# Patient Record
Sex: Female | Born: 1985
Health system: Southern US, Community
[De-identification: ages and names within clinical notes are randomized; demographics above are authoritative.]

## PROBLEM LIST (undated history)

## (undated) DIAGNOSIS — T7840XA Allergy, unspecified, initial encounter: Secondary | ICD-10-CM

## (undated) DIAGNOSIS — L309 Dermatitis, unspecified: Secondary | ICD-10-CM

## (undated) DIAGNOSIS — B019 Varicella without complication: Secondary | ICD-10-CM

## (undated) HISTORY — DX: Allergy, unspecified, initial encounter: T78.40XA

## (undated) HISTORY — PX: WISDOM TOOTH EXTRACTION: SHX21

## (undated) HISTORY — DX: Varicella without complication: B01.9

## (undated) HISTORY — DX: Dermatitis, unspecified: L30.9

---

## 2011-06-03 ENCOUNTER — Ambulatory Visit (INDEPENDENT_AMBULATORY_CARE_PROVIDER_SITE_OTHER): Payer: Managed Care, Other (non HMO) | Admitting: Family

## 2011-06-03 ENCOUNTER — Encounter: Payer: Self-pay | Admitting: Family

## 2011-06-03 VITALS — BP 100/80 | Ht 65.0 in | Wt 208.0 lb

## 2011-06-03 DIAGNOSIS — H9203 Otalgia, bilateral: Secondary | ICD-10-CM

## 2011-06-03 DIAGNOSIS — H6690 Otitis media, unspecified, unspecified ear: Secondary | ICD-10-CM

## 2011-06-03 DIAGNOSIS — H9209 Otalgia, unspecified ear: Secondary | ICD-10-CM

## 2011-06-03 MED ORDER — TRAMADOL HCL 50 MG PO TABS: 50.0000 mg | ORAL_TABLET | Freq: Three times a day (TID) | ORAL | Status: AC | PRN

## 2011-06-03 NOTE — Progress Notes (Signed)
  Subjective:    Patient ID: Patricia Chase, female    DOB: 1985-03-02, 26 y.o.   MRN: [id number]  HPI Comments: 26 yo black female presents with c/o bilateral ear discomfort and itching getting worse x several days. Pain described as pressure and throbbing 8/10. "Nothing makes the pain better or worse." Had same episode last year and was seen at [facility] and prescribed antibiotics. Denies childhood history of ear problems, fever/chills, sinus drainage, sorethroat, or cough.   Otalgia  Pertinent negatives include no ear discharge, hearing loss, neck pain or rhinorrhea.      Review of Systems  Constitutional: Negative.   HENT: Positive for ear pain. Negative for hearing loss, nosebleeds, congestion, facial swelling, rhinorrhea, sneezing, drooling, mouth sores, neck pain, neck stiffness, dental problem, postnasal drip, sinus pressure, tinnitus and ear discharge.   Eyes: Negative.   Respiratory: Negative.   Cardiovascular: Negative.    Past Medical History  Diagnosis Date  . Chicken pox   . Allergy     History   Social History  . Marital Status: Unknown    Spouse Name: N/A    Number of Children: N/A  . Years of Education: N/A   Occupational History  . Not on file.   Social History Main Topics  . Smoking status: Never Smoker   . Smokeless tobacco: Not on file  . Alcohol Use: Yes     occassionally  . Drug Use: No  . Sexually Active: Not on file   Other Topics Concern  . Not on file   Social History Narrative  . No narrative on file    No past surgical history on file.  Family History  Problem Relation Age of Onset  . Hypertension Mother   . Diabetes Mother   . Heart disease Father   . Hypertension Father   . Hypertension Maternal Grandmother   . Diabetes Maternal Grandmother   . Hypertension Paternal Grandmother   . Heart disease Paternal Grandfather   . Hypertension Paternal Grandfather     No Known Allergies  Current Outpatient Prescriptions on  File Prior to Visit  Medication Sig Dispense Refill  . loratadine (CLARITIN) 10 MG tablet Take 10 mg by mouth daily.        BP 100/80  Ht 5\' 5"  (1.651 m)  Wt 208 lb (94.348 kg)  BMI 34.61 kg/m2  LMP 04/02/2013chart     Objective:   Physical Exam  Constitutional: She is oriented to person, place, and time. She appears well-developed and well-nourished. No distress.  HENT:  Right Ear: External ear normal.  Left Ear: External ear normal.  Mouth/Throat: Oropharynx is clear and moist. No oropharyngeal exudate.  Eyes: Right eye exhibits no discharge. Left eye exhibits no discharge.  Cardiovascular: Normal rate, regular rhythm, normal heart sounds and intact distal pulses.  Exam reveals no gallop and no friction rub.   No murmur heard. Pulmonary/Chest: Effort normal and breath sounds normal. No respiratory distress. She has no wheezes. She has no rales. She exhibits no tenderness.  Neurological: She is alert and oriented to person, place, and time.  Skin: Skin is warm and dry. She is not diaphoretic.          Assessment & Plan:  Assessment: Otitis externa, Bilateral Ear Pain  Plan: Ofloxacin drops, Teaching handouts provided on diagnosis and treatments, opportunity for questions provided. Encouraged to RTC if s/s get worse. RTC for CPE

## 2011-06-03 NOTE — Patient Instructions (Signed)
Otitis Externa  Otitis externa ("swimmer's ear") is a germ (bacterial) or fungal infection of the outer ear canal (from the eardrum to the outside of the ear). Swimming in dirty water may cause swimmer's ear. It also may be caused by moisture in the ear from water remaining after swimming or bathing. Often the first signs of infection may be itching in the ear canal. This may progress to ear canal swelling, redness, and pus drainage, which may be signs of infection.  HOME CARE INSTRUCTIONS    Apply the antibiotic drops to the ear canal as prescribed by your doctor.   This can be a very painful medical condition. A strong pain reliever may be prescribed.   Only take over-the-counter or prescription medicines for pain, discomfort, or fever as directed by your caregiver.   If your caregiver has given you a follow-up appointment, it is very important to keep that appointment. Not keeping the appointment could result in a chronic or permanent injury, pain, hearing loss and disability. If there is any problem keeping the appointment, you must call back to this facility for assistance.  PREVENTION    It is important to keep your ear dry. Use the corner of a towel to wick water out of the ear canal after swimming or bathing.   Avoid scratching in your ear. This can damage the ear canal or remove the protective wax lining the canal and make it easier for germs (bacteria) or a fungus to grow.   You may use ear drops made of rubbing alcohol and vinegar after swimming to prevent future "swimmer's ear" infections. Make up a small bottle of equal parts white vinegar and alcohol. Put 3 or 4 drops into each ear after swimming.   Avoid swimming in lakes, polluted water, or poorly chlorinated pools.  SEEK MEDICAL CARE IF:    An oral temperature above 102 F (38.9 C) develops.   Your ear is still painful after 3 days and shows signs of getting worse (redness, swelling, pain, or pus).  MAKE SURE YOU:    Understand these  instructions.   Will watch your condition.   Will get help right away if you are not doing well or get worse.  Document Released: 01/27/2005 Document Revised: 01/16/2011 Document Reviewed: 09/03/2007  [employer] Patient Information 2012 [employer].

## 2012-06-16 ENCOUNTER — Other Ambulatory Visit (INDEPENDENT_AMBULATORY_CARE_PROVIDER_SITE_OTHER): Payer: BC Managed Care – PPO

## 2012-06-16 DIAGNOSIS — Z0000 Encounter for general adult medical examination without abnormal findings: Secondary | ICD-10-CM

## 2012-06-16 LAB — CBC WITH DIFFERENTIAL/PLATELET
Basophils Relative: 0.2 % (ref 0.0–3.0)
Eosinophils Relative: 0.5 % (ref 0.0–5.0)
HCT: 37.2 % (ref 36.0–46.0)
Hemoglobin: 12.7 g/dL (ref 12.0–15.0)
Lymphs Abs: 3 10*3/uL (ref 0.7–4.0)
MCHC: 34.1 g/dL (ref 30.0–36.0)
MCV: 83.8 fl (ref 78.0–100.0)
Monocytes Absolute: 0.6 10*3/uL (ref 0.1–1.0)
Neutro Abs: 7.1 10*3/uL (ref 1.4–7.7)
Neutrophils Relative %: 66 % (ref 43.0–77.0)
RBC: 4.44 Mil/uL (ref 3.87–5.11)
WBC: 10.8 10*3/uL — ABNORMAL HIGH (ref 4.5–10.5)

## 2012-06-16 LAB — POCT URINALYSIS DIPSTICK
Ketones, UA: NEGATIVE
Protein, UA: NEGATIVE
Urobilinogen, UA: 0.2
pH, UA: 6.5

## 2012-06-16 LAB — HEPATIC FUNCTION PANEL
ALT: 16 U/L (ref 0–35)
Albumin: 3.8 g/dL (ref 3.5–5.2)
Bilirubin, Direct: 0 mg/dL (ref 0.0–0.3)
Total Protein: 6.8 g/dL (ref 6.0–8.3)

## 2012-06-16 LAB — BASIC METABOLIC PANEL
CO2: 26 mEq/L (ref 19–32)
Chloride: 107 mEq/L (ref 96–112)
Potassium: 3.6 mEq/L (ref 3.5–5.1)
Sodium: 138 mEq/L (ref 135–145)

## 2012-06-16 LAB — LIPID PANEL
LDL Cholesterol: 66 mg/dL (ref 0–99)
VLDL: 11.2 mg/dL (ref 0.0–40.0)

## 2012-06-22 ENCOUNTER — Encounter: Payer: Managed Care, Other (non HMO) | Admitting: Family

## 2012-07-06 ENCOUNTER — Other Ambulatory Visit (HOSPITAL_COMMUNITY)
Admission: RE | Admit: 2012-07-06 | Discharge: 2012-07-06 | Disposition: A | Discharge status: Home / Self Care | Payer: BC Managed Care – PPO | Source: Ambulatory Visit | Attending: Family | Admitting: Family

## 2012-07-06 ENCOUNTER — Ambulatory Visit (INDEPENDENT_AMBULATORY_CARE_PROVIDER_SITE_OTHER): Payer: BC Managed Care – PPO | Admitting: Family

## 2012-07-06 ENCOUNTER — Encounter: Payer: Self-pay | Admitting: Family

## 2012-07-06 VITALS — BP 98/62 | HR 94 | Ht 65.0 in | Wt 208.0 lb

## 2012-07-06 DIAGNOSIS — Z01419 Encounter for gynecological examination (general) (routine) without abnormal findings: Secondary | ICD-10-CM | POA: Insufficient documentation

## 2012-07-06 DIAGNOSIS — Z23 Encounter for immunization: Secondary | ICD-10-CM

## 2012-07-06 DIAGNOSIS — E669 Obesity, unspecified: Secondary | ICD-10-CM

## 2012-07-06 DIAGNOSIS — Z0000 Encounter for general adult medical examination without abnormal findings: Secondary | ICD-10-CM

## 2012-07-06 LAB — POCT URINALYSIS DIPSTICK
Glucose, UA: NEGATIVE
Spec Grav, UA: 1.02
Urobilinogen, UA: 0.2

## 2012-07-06 NOTE — Progress Notes (Signed)
  Subjective:    Patient ID: Patricia Chase, female    DOB: 1985-10-20, 27 y.o.   MRN: [id number]  HPI 27 year old female presents to PCP for annual physical. Denies any issues at this time. Reports LMP 06/22/2012. States she is not sexually active; Reports G0P0. Confirms monthly self breast exam.    Review of Systems  Constitutional: Negative.   HENT: Negative.   Eyes: Negative.   Respiratory: Negative.   Cardiovascular: Negative.   Gastrointestinal: Negative.   Endocrine: Negative.   Genitourinary: Negative.   Musculoskeletal: Negative.   Skin: Negative.   Allergic/Immunologic: Negative.   Neurological: Negative.   Hematological: Negative.   Psychiatric/Behavioral: Negative.    Past Medical History  Diagnosis Date  . Chicken pox   . Allergy     History   Social History  . Marital Status: Single    Spouse Name: N/A    Number of Children: N/A  . Years of Education: N/A   Occupational History  . Not on file.   Social History Main Topics  . Smoking status: Never Smoker   . Smokeless tobacco: Not on file  . Alcohol Use: Yes     Comment: occassionally  . Drug Use: No  . Sexually Active: Not on file   Other Topics Concern  . Not on file   Social History Narrative  . No narrative on file    No past surgical history on file.  Family History  Problem Relation Age of Onset  . Hypertension Mother   . Diabetes Mother   . Heart disease Father   . Hypertension Father   . Hypertension Maternal Grandmother   . Diabetes Maternal Grandmother   . Hypertension Paternal Grandmother   . Heart disease Paternal Grandfather   . Hypertension Paternal Grandfather     No Known Allergies  Current Outpatient Prescriptions on File Prior to Visit  Medication Sig Dispense Refill  . loratadine (CLARITIN) 10 MG tablet Take 10 mg by mouth daily.       No current facility-administered medications on file prior to visit.    BP 98/62  Pulse 94  Ht 5\' 5"  (1.651 m)  Wt 208 lb  (94.348 kg)  BMI 34.61 kg/m2  LMP 05/13/2014chart               Objective:   Physical Exam  Constitutional: She is oriented to person, place, and time. She appears well-developed and well-nourished.  HENT:  Head: Normocephalic and atraumatic.  Mouth/Throat: Oropharynx is clear and moist.  Eyes: Pupils are equal, round, and reactive to light.  Neck: Normal range of motion.  Cardiovascular: Normal rate, regular rhythm, normal heart sounds and intact distal pulses.   Pulmonary/Chest: Effort normal and breath sounds normal.  Abdominal: Soft. Bowel sounds are normal. She exhibits no distension. There is no tenderness. There is no rebound.  Genitourinary: Vagina normal and uterus normal.  Musculoskeletal: Normal range of motion.  Neurological: She is alert and oriented to person, place, and time. She has normal reflexes.  Skin: Skin is warm and dry.  Psychiatric: She has a normal mood and affect.          Assessment & Plan:  1. Complete Physical Exam  2. Obesity  Plan: Encouraged to adopt a healthy diet and physical activity 30-60 minutes 5-7 days per week. Instructed to follow up with PCP for any acute issues and return for annual routine physical. Monthly self breast exams.

## 2012-12-16 ENCOUNTER — Other Ambulatory Visit: Payer: Self-pay

## 2013-09-02 ENCOUNTER — Encounter: Payer: Self-pay | Admitting: Family

## 2013-09-02 ENCOUNTER — Ambulatory Visit (INDEPENDENT_AMBULATORY_CARE_PROVIDER_SITE_OTHER): Payer: BC Managed Care – PPO | Admitting: Family

## 2013-09-02 VITALS — BP 102/64 | HR 93 | Ht 65.0 in | Wt 259.5 lb

## 2013-09-02 DIAGNOSIS — L219 Seborrheic dermatitis, unspecified: Secondary | ICD-10-CM | POA: Insufficient documentation

## 2013-09-02 DIAGNOSIS — L218 Other seborrheic dermatitis: Secondary | ICD-10-CM

## 2013-09-02 DIAGNOSIS — Z0000 Encounter for general adult medical examination without abnormal findings: Secondary | ICD-10-CM

## 2013-09-02 LAB — CBC WITH DIFFERENTIAL/PLATELET
Basophils Absolute: 0 10*3/uL (ref 0.0–0.1)
Basophils Relative: 0 % (ref 0–1)
EOS ABS: 0 10*3/uL (ref 0.0–0.7)
EOS PCT: 0 % (ref 0–5)
HCT: 37.7 % (ref 36.0–46.0)
HEMOGLOBIN: 12.5 g/dL (ref 12.0–15.0)
LYMPHS ABS: 3.5 10*3/uL (ref 0.7–4.0)
Lymphocytes Relative: 28 % (ref 12–46)
MCH: 27.1 pg (ref 26.0–34.0)
MCHC: 33.2 g/dL (ref 30.0–36.0)
MCV: 81.6 fL (ref 78.0–100.0)
MONO ABS: 0.6 10*3/uL (ref 0.1–1.0)
MONOS PCT: 5 % (ref 3–12)
Neutro Abs: 8.3 10*3/uL — ABNORMAL HIGH (ref 1.7–7.7)
Neutrophils Relative %: 67 % (ref 43–77)
Platelets: 361 10*3/uL (ref 150–400)
RBC: 4.62 MIL/uL (ref 3.87–5.11)
RDW: 14.6 % (ref 11.5–15.5)
WBC: 12.4 10*3/uL — ABNORMAL HIGH (ref 4.0–10.5)

## 2013-09-02 MED ORDER — DERMA-SMOOTHE/FS BODY 0.01 % EX OIL: 1.0000 "application " | TOPICAL_OIL | CUTANEOUS | Status: AC

## 2013-09-02 NOTE — Patient Instructions (Addendum)
Seborrheic Dermatitis Seborrheic dermatitis involves pink or red skin with greasy, flaky scales. This is often found on the scalp, eyebrows, nose, bearded area, and on or behind the ears. It can also occur on the central chest. It often occurs where there are more oil (sebaceous) glands. This condition is also known as dandruff. When this condition affects a baby's scalp, it is called cradle cap. It may come and go for no known reason. It can occur at any time of life from infancy to old age. CAUSES  The cause is unknown. It is not the result of too little moisture or too much oil. In some people, seborrheic dermatitis flare-ups seem to be triggered by stress. It also commonly occurs in people with certain diseases such as Parkinson's disease or HIV/AIDS. SYMPTOMS   Thick scales on the scalp.  Redness on the face or in the armpits.  The skin may seem oily or dry, but moisturizers do not help.  In infants, seborrheic dermatitis appears as scaly redness that does not seem to bother the baby. In some babies, it affects only the scalp. In others, it also affects the neck creases, armpits, groin, or behind the ears.  In adults and adolescents, seborrheic dermatitis may affect only the scalp. It may look patchy or spread out, with areas of redness and flaking. Other areas commonly affected include:  Eyebrows.  Eyelids.  Forehead.  Skin behind the ears.  Outer ears.  Chest.  Armpits.  Nose creases.  Skin creases under the breasts.  Skin between the buttocks.  Groin.  Some adults and adolescents feel itching or burning in the affected areas. DIAGNOSIS  Your caregiver can usually tell what the problem is by doing a physical exam. TREATMENT   Cortisone (steroid) ointments, creams, and lotions can help decrease inflammation.  Babies can be treated with baby oil to soften the scales, then they may be washed with baby shampoo. If this does not help, a prescription topical steroid  medicine may work.  Adults can use medicated shampoos.  Your caregiver may prescribe corticosteroid cream and shampoo containing an antifungal or yeast medicine (ketoconazole). Hydrocortisone or anti-yeast cream can be rubbed directly onto seborrheic dermatitis patches. Yeast does not cause seborrheic dermatitis, but it seems to add to the problem. In infants, seborrheic dermatitis is often worst during the first year of life. It tends to disappear on its own as the child grows. However, it may return during the teenage years. In adults and adolescents, seborrheic dermatitis tends to be a long-lasting condition that comes and goes over many years. HOME CARE INSTRUCTIONS   Use prescribed medicines as directed.  In infants, do not aggressively remove the scales or flakes on the scalp with a comb or by other means. This may lead to hair loss. SEEK MEDICAL CARE IF:   The problem does not improve from the medicated shampoos, lotions, or other medicines given by your caregiver.  You have any other questions or concerns. Document Released: 01/27/2005 Document Revised: 07/29/2011 Document Reviewed: 06/18/2009 [facility] Patient Information 2015 [place], [place]. This information is not intended to replace advice given to you by your health care provider. Make sure you discuss any questions you have with your health care provider.   Exercise to Lose Weight Exercise and a healthy diet may help you lose weight. Your doctor may suggest specific exercises. EXERCISE IDEAS AND TIPS  Choose low-cost things you enjoy doing, such as walking, bicycling, or exercising to workout videos.  Take stairs instead  of the elevator.  Walk during your lunch break.  Park your car further away from work or school.  Go to a gym or an exercise class.  Start with 5 to 10 minutes of exercise each day. Build up to 30 minutes of exercise 4 to 6 days a week.  Wear shoes with good support and comfortable  clothes.  Stretch before and after working out.  Work out until you breathe harder and your heart beats faster.  Drink extra water when you exercise.  Do not do so much that you hurt yourself, feel dizzy, or get very short of breath. Exercises that burn about 150 calories:  Running 1  miles in 15 minutes.  Playing volleyball for 45 to 60 minutes.  Washing and waxing a car for 45 to 60 minutes.  Playing touch football for 45 minutes.  Walking 1  miles in 35 minutes.  Pushing a stroller 1  miles in 30 minutes.  Playing basketball for 30 minutes.  Raking leaves for 30 minutes.  Bicycling 5 miles in 30 minutes.  Walking 2 miles in 30 minutes.  Dancing for 30 minutes.  Shoveling snow for 15 minutes.  Swimming laps for 20 minutes.  Walking up stairs for 15 minutes.  Bicycling 4 miles in 15 minutes.  Gardening for 30 to 45 minutes.  Jumping rope for 15 minutes.  Washing windows or floors for 45 to 60 minutes. Document Released: 03/01/2010 Document Revised: 04/21/2011 Document Reviewed: 03/01/2010 [facility] Patient Information 2015 [place], [place]. This information is not intended to replace advice given to you by your health care provider. Make sure you discuss any questions you have with your health care provider.

## 2013-09-02 NOTE — Progress Notes (Signed)
Subjective:    Patient ID: Patricia Chase, female    DOB: [birth year], 28 y.o.   MRN: [id number]  HPI 28 year old [place] American female, nonsmoker, is in today for CPX. Has had a 50 pound weight gain. 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 Has concerns of dry, itchy, flaky scalp. She is wearing her hair natural but often gets braids. Has been applying all natural moisturizing creams without relief.    Review of Systems  Constitutional: Negative.   Eyes: Negative.   Respiratory: Negative.   Cardiovascular: Negative.   Gastrointestinal: Negative.   Endocrine: Negative.   Genitourinary: Negative.   Musculoskeletal: Negative.   Skin: Negative.   Allergic/Immunologic: Negative.   Neurological: Negative.   Hematological: Negative.   Psychiatric/Behavioral: Negative.    Past Medical History  Diagnosis Date  . Chicken pox   . Allergy     History   Social History  . Marital Status: Single    Spouse Name: N/A    Number of Children: N/A  . Years of Education: N/A   Occupational History  . Not on file.   Social History Main Topics  . Smoking status: Never Smoker   . Smokeless tobacco: Not on file  . Alcohol Use: Yes     Comment: occassionally  . Drug Use: No  . Sexual Activity: Not on file   Other Topics Concern  . Not on file   Social History Narrative  . No narrative on file    History reviewed. No pertinent past surgical history.  Family History  Problem Relation Age of Onset  . Hypertension Mother   . Diabetes Mother   . Heart disease Father   . Hypertension Father   . Hypertension Maternal Grandmother   . Diabetes Maternal Grandmother   . Hypertension Paternal Grandmother   . Heart disease Paternal Grandfather   . Hypertension Paternal Grandfather     No Known Allergies  Current Outpatient Prescriptions on File Prior to Visit  Medication Sig Dispense Refill  . Biotin 10 MG TABS Take by mouth.      . loratadine (CLARITIN) 10 MG tablet Take  10 mg by mouth daily.      . Multiple Vitamin (MULTIVITAMIN WITH MINERALS) TABS Take 1 tablet by mouth daily.       No current facility-administered medications on file prior to visit.    BP 102/64  Pulse 93  Ht 5\' 5"  (1.651 m)  Wt 259 lb 8 oz (117.708 kg)  BMI 43.18 kg/m2  LMP 07/01/2015chart    Objective:   Physical Exam  Constitutional: She is oriented to person, place, and time. She appears well-developed and well-nourished.  HENT:  Head: Normocephalic and atraumatic.  Right Ear: External ear normal.  Left Ear: External ear normal.  Nose: Nose normal.  Mouth/Throat: Oropharynx is clear and moist.  Eyes: Conjunctivae and EOM are normal. Pupils are equal, round, and reactive to light.  Neck: Normal range of motion. Neck supple. No thyromegaly present.  Cardiovascular: Normal rate, regular rhythm and normal heart sounds.   Pulmonary/Chest: Effort normal and breath sounds normal.  Abdominal: Soft. Bowel sounds are normal.  Musculoskeletal: Normal range of motion. She exhibits no edema and no tenderness.  Neurological: She is alert and oriented to person, place, and time. She has normal reflexes. She displays normal reflexes. No cranial nerve deficit. Coordination normal.  Skin: Skin is warm and dry.  Psychiatric: She has a normal mood and affect.  Assessment & Plan:  Elaf was seen today for annual exam.  Diagnoses and associated orders for this visit:  Preventative health care - Cancel: CMP - Cancel: Lipid Panel - Cancel: CBC with Differential - Cancel: TSH - POC Urinalysis Dipstick - CBC with Differential - Comprehensive metabolic panel - TSH - Lipid panel  Seborrheic dermatitis of scalp  Morbid obesity  Other Orders - Fluocinolone Acetonide (DERMA-SMOOTHE/FS BODY) 0.01 % OIL; Apply 1 application topically 3 (three) times a week.   Call the office with any questions or concerns. Encouraged a healthy diet, exercise, monthly self breast exams. See  Dr. Leonie Green with continued hair concerns.

## 2013-09-02 NOTE — Progress Notes (Signed)
Pre visit review using our clinic review tool, if applicable. No additional management support is needed unless otherwise documented below in the visit note. 

## 2013-09-03 LAB — COMPREHENSIVE METABOLIC PANEL
ALT: 24 U/L (ref 0–35)
AST: 23 U/L (ref 0–37)
Albumin: 4 g/dL (ref 3.5–5.2)
Alkaline Phosphatase: 59 U/L (ref 39–117)
BILIRUBIN TOTAL: 0.5 mg/dL (ref 0.2–1.2)
BUN: 6 mg/dL (ref 6–23)
CO2: 24 meq/L (ref 19–32)
CREATININE: 0.74 mg/dL (ref 0.50–1.10)
Calcium: 9 mg/dL (ref 8.4–10.5)
Chloride: 104 mEq/L (ref 96–112)
GLUCOSE: 80 mg/dL (ref 70–99)
Potassium: 4.2 mEq/L (ref 3.5–5.3)
Sodium: 139 mEq/L (ref 135–145)
TOTAL PROTEIN: 6.8 g/dL (ref 6.0–8.3)

## 2013-09-03 LAB — TSH: TSH: 2.382 u[IU]/mL (ref 0.350–4.500)

## 2013-09-03 LAB — LIPID PANEL
CHOLESTEROL: 123 mg/dL (ref 0–200)
HDL: 51 mg/dL (ref >39)
LDL Cholesterol: 60 mg/dL (ref 0–99)
Total CHOL/HDL Ratio: 2.4 Ratio
Triglycerides: 60 mg/dL (ref <150)
VLDL: 12 mg/dL (ref 0–40)

## 2013-09-05 ENCOUNTER — Other Ambulatory Visit: Payer: Self-pay | Admitting: Family

## 2013-09-05 LAB — POCT URINALYSIS DIPSTICK
BILIRUBIN UA: NEGATIVE
GLUCOSE UA: NEGATIVE
Nitrite, UA: POSITIVE
PH UA: 7
RBC UA: NEGATIVE
SPEC GRAV UA: 1.02
UROBILINOGEN UA: 0.2

## 2013-09-05 MED ORDER — SULFAMETHOXAZOLE-TRIMETHOPRIM 800-160 MG PO TABS: 1.0000 | ORAL_TABLET | Freq: Two times a day (BID) | ORAL | Status: AC

## 2013-10-31 ENCOUNTER — Encounter: Payer: Self-pay | Admitting: Family

## 2013-11-01 ENCOUNTER — Other Ambulatory Visit: Payer: Self-pay | Admitting: Family

## 2013-11-11 ENCOUNTER — Ambulatory Visit (INDEPENDENT_AMBULATORY_CARE_PROVIDER_SITE_OTHER)
Admission: RE | Admit: 2013-11-11 | Discharge: 2013-11-11 | Disposition: A | Discharge status: Home / Self Care | Payer: BC Managed Care – PPO | Source: Ambulatory Visit | Attending: Family | Admitting: Family

## 2013-11-11 ENCOUNTER — Other Ambulatory Visit: Payer: Self-pay | Admitting: Family

## 2013-11-11 DIAGNOSIS — M25461 Effusion, right knee: Secondary | ICD-10-CM

## 2014-03-06 ENCOUNTER — Emergency Department (HOSPITAL_COMMUNITY)
Admission: EM | Admit: 2014-03-06 | Discharge: 2014-03-06 | Discharge status: Absent without leave | Payer: Self-pay | Source: Home / Self Care

## 2014-12-02 IMAGING — CR DG KNEE COMPLETE 4+V*R*
4 series · 4 of 4 positions shown · non-contrast
Comparison: None.

CLINICAL DATA: Lateral knee pain.

EXAM:
RIGHT KNEE - COMPLETE 4+ VIEW

[view not recorded (1 of 4)]
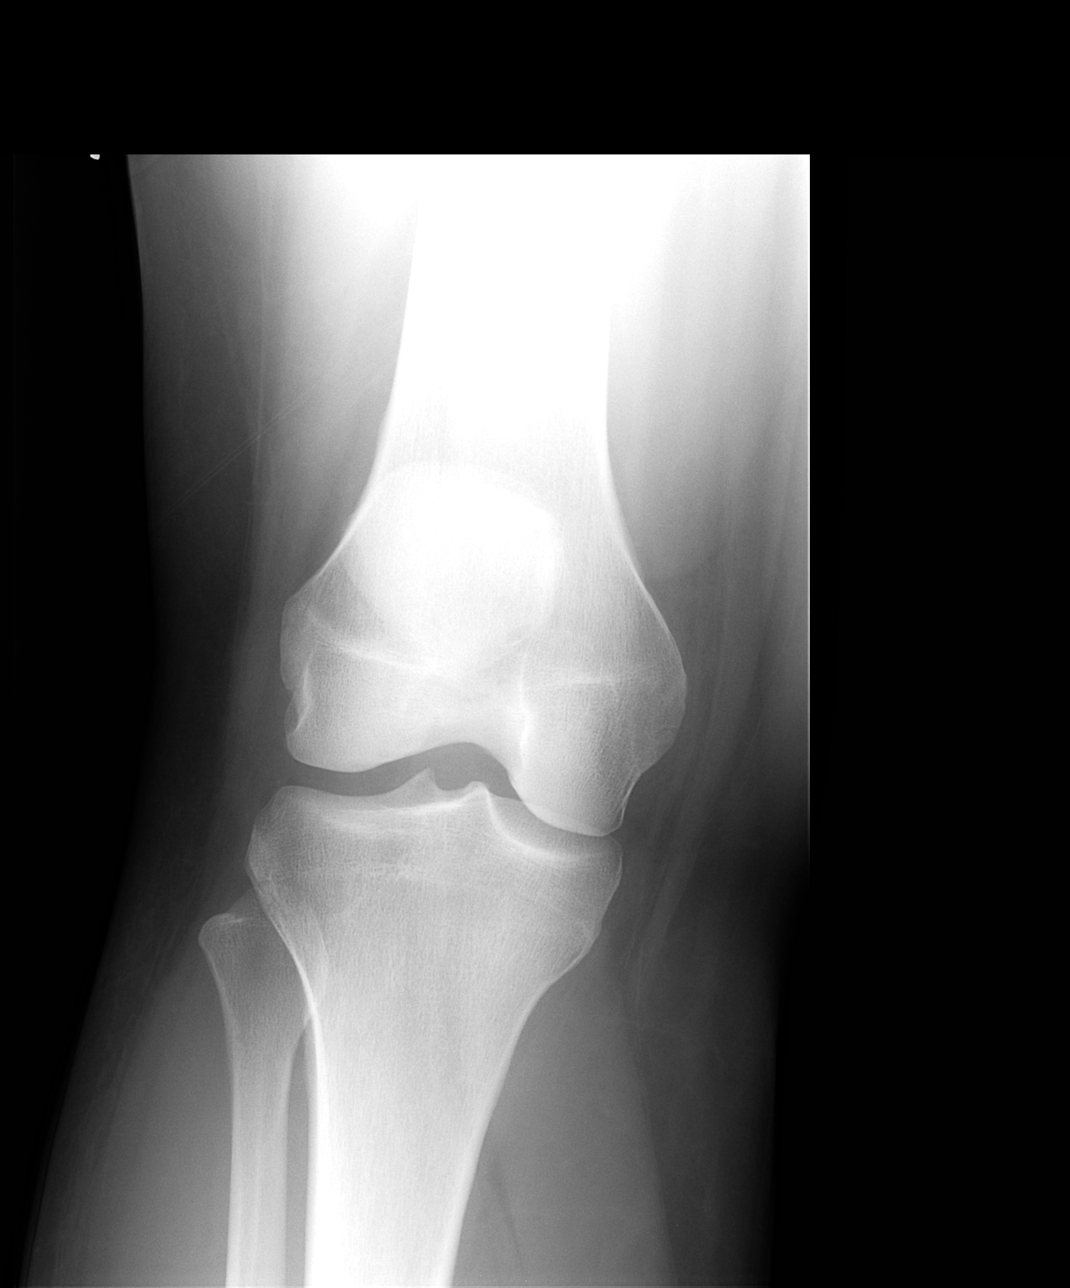

[view not recorded (2 of 4)]
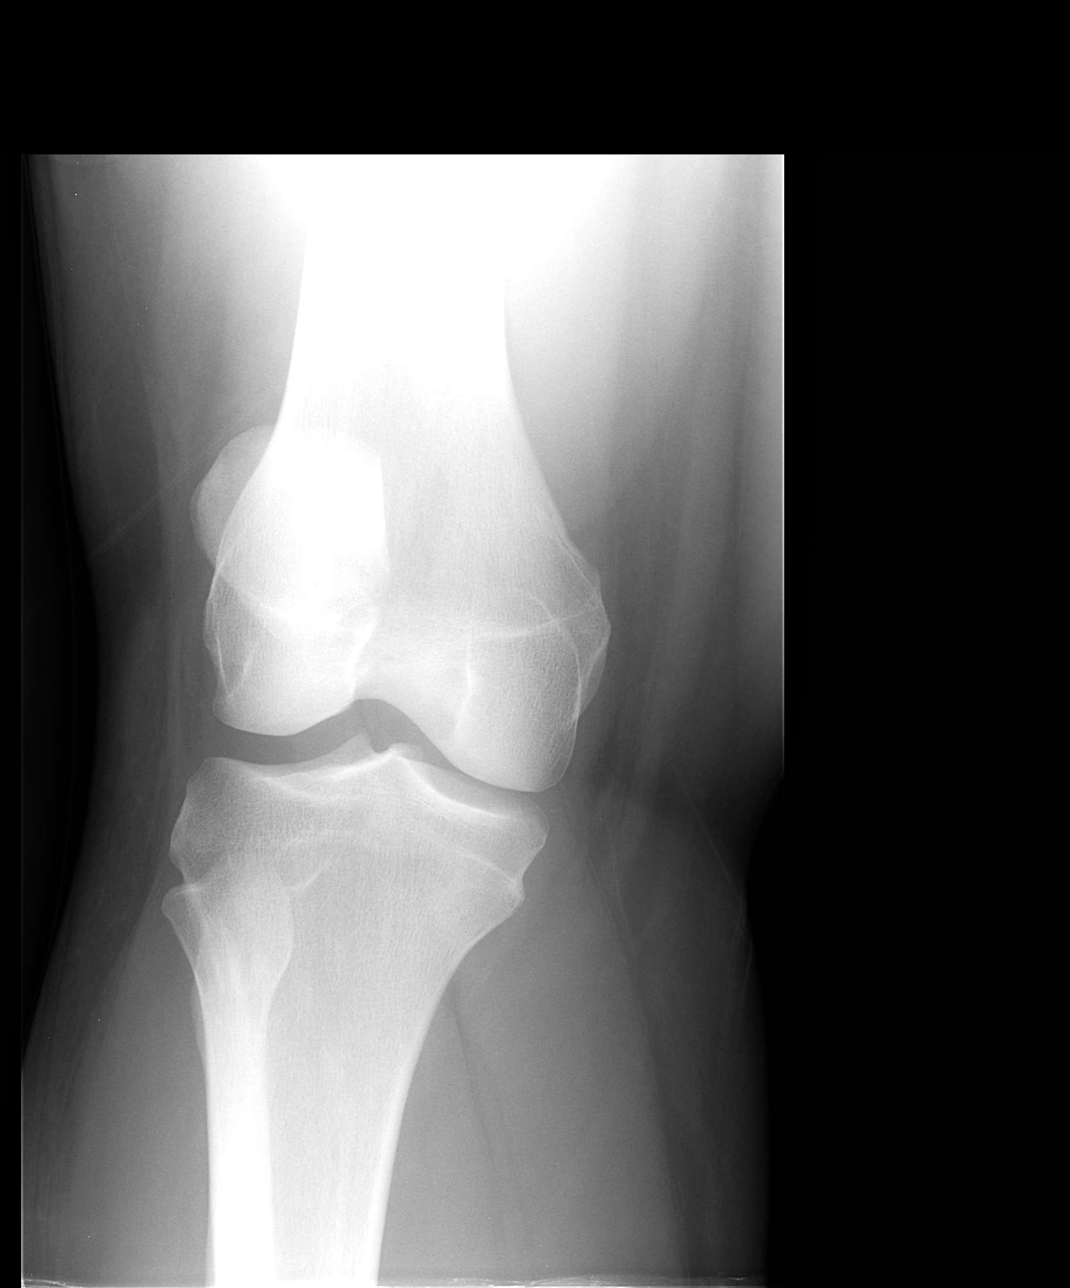

[view not recorded (3 of 4)]
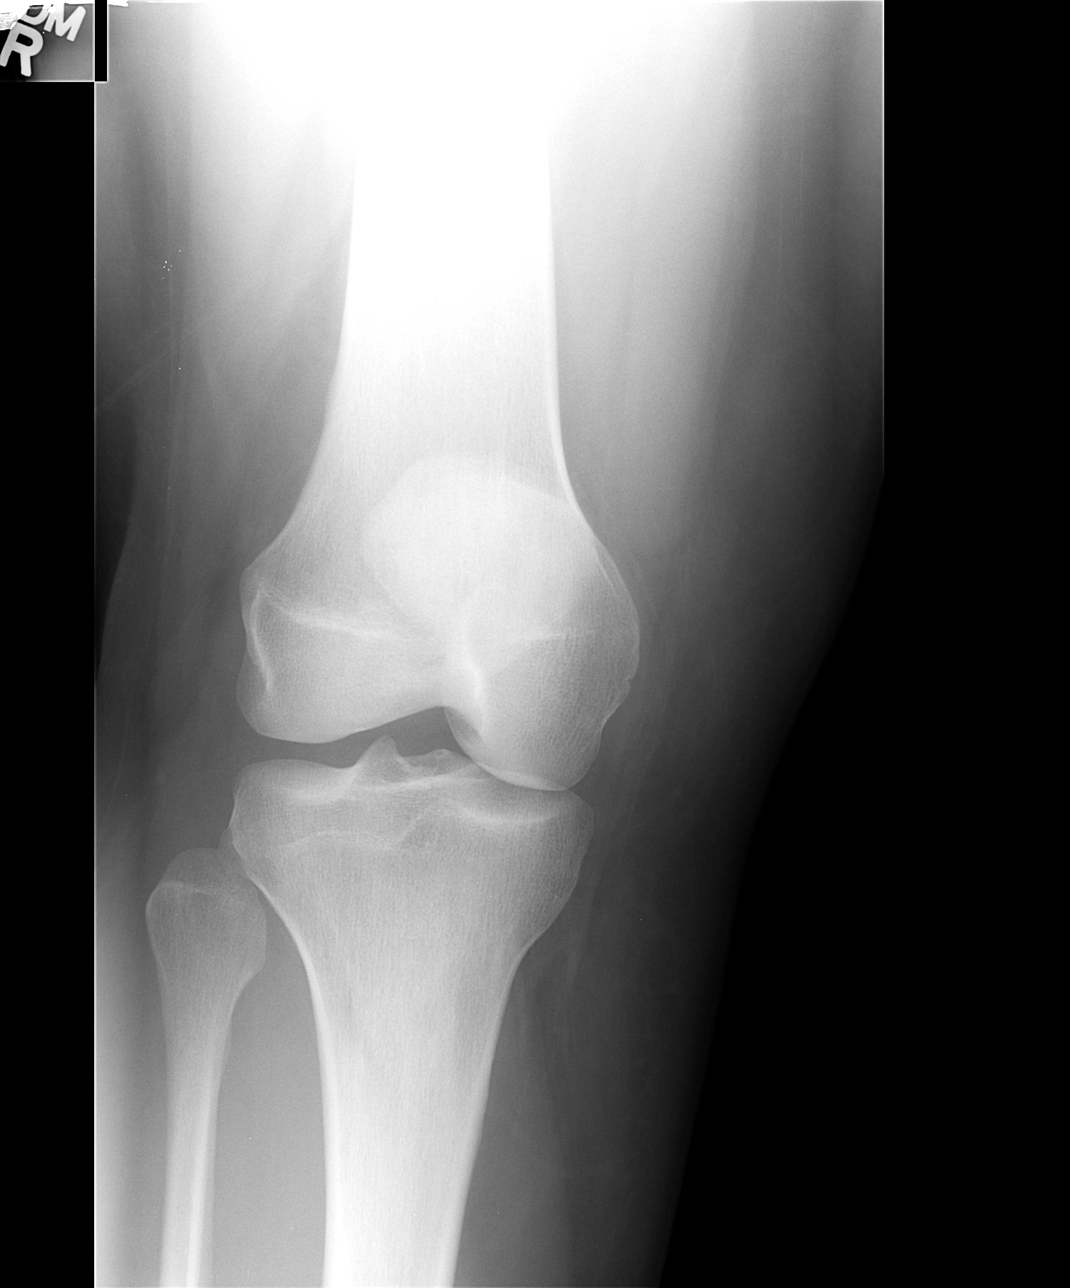

[view not recorded (4 of 4)]
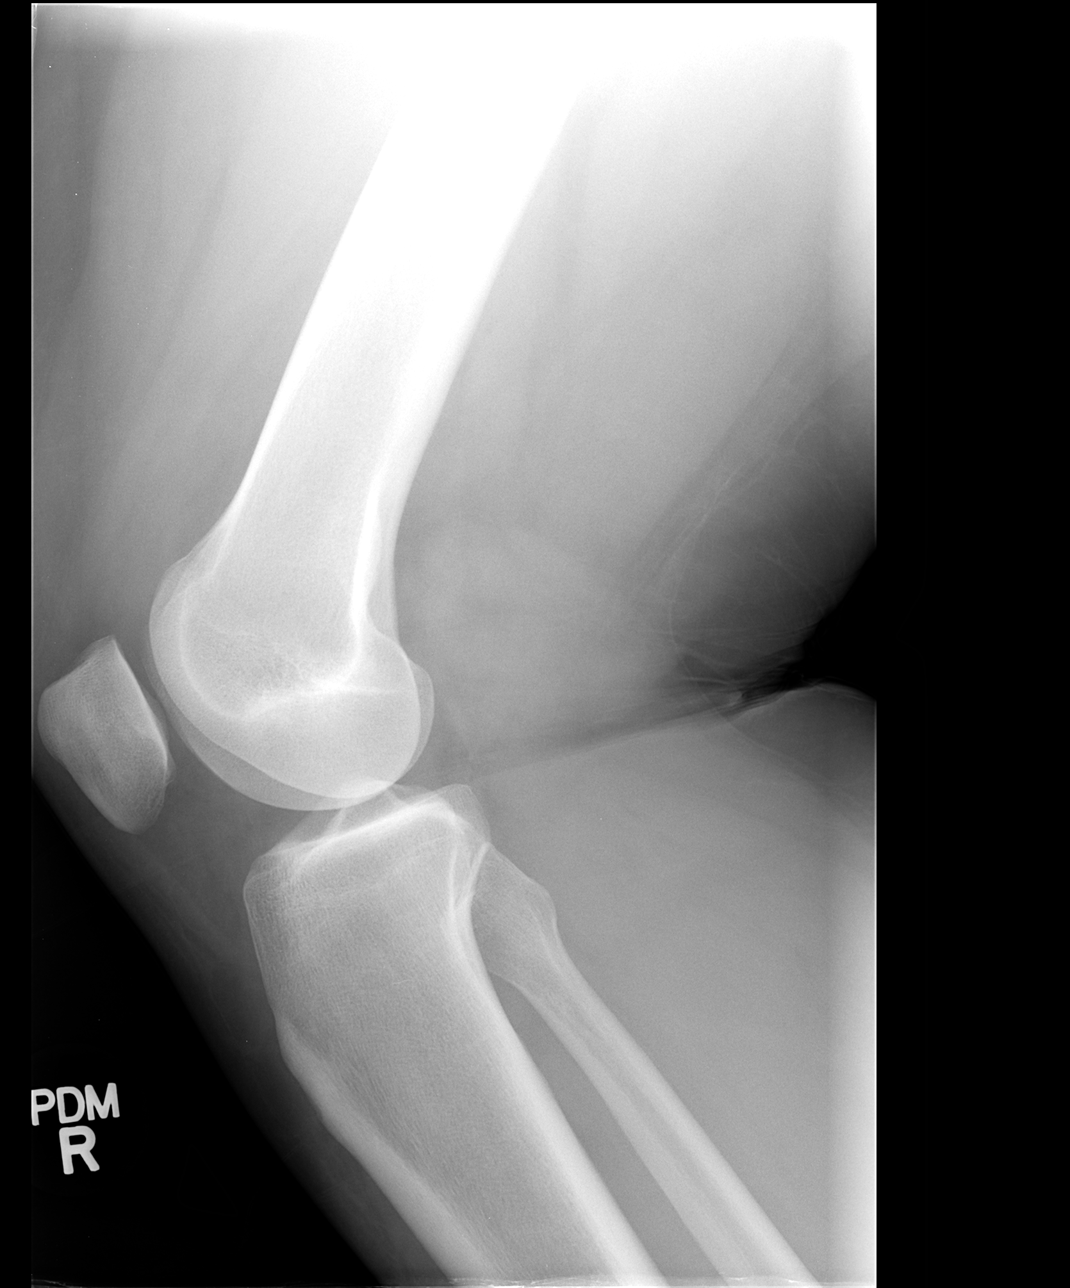

[4 of 4 positions shown; findings below may reference images not displayed]

FINDINGS: There is no evidence of fracture, dislocation, or joint effusion.
There is no evidence of arthropathy or other focal bone abnormality.
Soft tissues are unremarkable.
IMPRESSION: Negative.

## 2015-09-20 LAB — HM PAP SMEAR

## 2015-12-21 DIAGNOSIS — Z3041 Encounter for surveillance of contraceptive pills: Secondary | ICD-10-CM | POA: Insufficient documentation

## 2016-02-14 DIAGNOSIS — Z719 Counseling, unspecified: Secondary | ICD-10-CM | POA: Diagnosis not present

## 2016-02-14 DIAGNOSIS — J069 Acute upper respiratory infection, unspecified: Secondary | ICD-10-CM | POA: Diagnosis not present

## 2016-04-24 DIAGNOSIS — L219 Seborrheic dermatitis, unspecified: Secondary | ICD-10-CM | POA: Diagnosis not present

## 2016-04-24 DIAGNOSIS — L299 Pruritus, unspecified: Secondary | ICD-10-CM | POA: Diagnosis not present

## 2016-08-12 DIAGNOSIS — Z0000 Encounter for general adult medical examination without abnormal findings: Secondary | ICD-10-CM | POA: Diagnosis not present

## 2016-12-25 DIAGNOSIS — L219 Seborrheic dermatitis, unspecified: Secondary | ICD-10-CM | POA: Diagnosis not present

## 2017-05-22 ENCOUNTER — Other Ambulatory Visit: Payer: Self-pay | Admitting: Family Medicine

## 2017-06-22 ENCOUNTER — Encounter: Payer: Self-pay | Admitting: Emergency Medicine

## 2017-06-22 ENCOUNTER — Encounter: Payer: Self-pay | Admitting: Family Medicine

## 2017-06-23 ENCOUNTER — Ambulatory Visit (INDEPENDENT_AMBULATORY_CARE_PROVIDER_SITE_OTHER): Payer: 59 | Admitting: Family Medicine

## 2017-06-23 ENCOUNTER — Encounter: Payer: Self-pay | Admitting: Family Medicine

## 2017-06-23 ENCOUNTER — Other Ambulatory Visit: Payer: Self-pay

## 2017-06-23 VITALS — BP 134/82 | HR 91 | Temp 98.1°F | Resp 17 | Ht 66.0 in | Wt 265.8 lb

## 2017-06-23 DIAGNOSIS — Z3041 Encounter for surveillance of contraceptive pills: Secondary | ICD-10-CM | POA: Diagnosis not present

## 2017-06-23 DIAGNOSIS — B360 Pityriasis versicolor: Secondary | ICD-10-CM

## 2017-06-23 MED ORDER — KETOCONAZOLE 2 % EX CREA: 1.0000 "application " | TOPICAL_CREAM | Freq: Two times a day (BID) | CUTANEOUS | 0 refills | Status: AC

## 2017-06-23 MED ORDER — NORTREL 1/35 (28) 1-35 MG-MCG PO TABS: 1.0000 | ORAL_TABLET | Freq: Every day | ORAL | 3 refills | Status: DC

## 2017-06-23 NOTE — Progress Notes (Signed)
Subjective  CC:  Chief Complaint  Patient presents with  . Establish Care    Recent patient at [facility], last cpe july 2018    HPI: Patricia Chase is a 32 y.o. female is a former [facility] patient and is here to reestablish care with me today.   She has the following concerns or needs:  Needs refill on birth control pills. Doing well with them. Regular cycles. Not currently sexually active. No leg swelling or calf pain.   Obesity: trying to eat healthier but work schedule is the main barrier to that. Isn't exercising due to time. But will now consider doing more since on summer break for work.   C/o rash on bilateral wrists x 2 months with some itching. No associated sxs.   We updated and reviewed the patient's past history in detail and it is documented below.  Patient Active Problem List   Diagnosis Date Noted  . Oral contraceptive pill surveillance 12/21/2015  . Morbid obesity ([place]) 09/02/2013  . Seborrheic dermatitis of scalp 09/02/2013   Health Maintenance  Topic Date Due  . HIV Screening  08/08/2000  . INFLUENZA VACCINE  09/10/2017  . PAP SMEAR  09/20/2018  . TETANUS/TDAP  07/07/2022   Immunization History  Administered Date(s) Administered  . Tdap 07/06/2012   Current Meds  Medication Sig  . Biotin 10 MG TABS Take by mouth.  . Ciclopirox 1 % shampoo APPLY TO SCALP AND SHAMPOO. REPEAT EVERY WEEK TO EVERY OTHER WEEK  . Fluocinolone Acetonide (DERMA-SMOOTHE/FS BODY) 0.01 % OIL Apply 1 application topically 3 (three) times a week.  . loratadine (CLARITIN) 10 MG tablet Take 10 mg by mouth daily.  . Multiple Vitamin (MULTIVITAMIN WITH MINERALS) TABS Take 1 tablet by mouth daily.  Marland Kitchen NORTREL 1/35, 28, tablet Take 1 tablet by mouth daily.  . [DISCONTINUED] NORTREL 1/35, 28, tablet Take 1 tablet by mouth daily.    Allergies: Patient has No Known Allergies. Past Medical History Patient  has a past medical history of Allergy, Chicken pox, and Eczema. Past Surgical  History Patient  has a past surgical history that includes Wisdom tooth extraction. Family History: Patient family history includes Cerebral palsy in her brother; Diabetes in her maternal grandmother and mother; Heart disease in her father and paternal grandfather; Hypertension in her father, maternal grandmother, mother, paternal grandfather, and paternal grandmother; Seizures in her brother; Thyroid disease in her mother. Social History:  Patient  reports that she has never smoked. She has never used smokeless tobacco. She reports that she drinks about 0.6 oz of alcohol per week. She reports that she does not use drugs.  Review of Systems: Constitutional: negative for fever or malaise Ophthalmic: negative for photophobia, double vision or loss of vision Cardiovascular: negative for chest pain, dyspnea on exertion, or new LE swelling Respiratory: negative for SOB or persistent cough Gastrointestinal: negative for abdominal pain, change in bowel habits or melena Genitourinary: negative for dysuria or gross hematuria Musculoskeletal: negative for new gait disturbance or muscular weakness Integumentary: negative for new or persistent rashes Neurological: negative for TIA or stroke symptoms Psychiatric: negative for SI or delusions Allergic/Immunologic: negative for hives  Patient Care Team    Relationship Specialty Notifications Start End  Leamon Arnt, MD PCP - General Family Medicine  06/23/17   Loney Loh, MD  Dermatology  06/23/17     Objective  Vitals: BP 134/82   Pulse 91   Temp 98.1 F (36.7 C) (Oral)   Resp 17  Ht 5\' 6"  (1.676 m)   Wt 265 lb 12.8 oz (120.6 kg)   SpO2 98%   BMI 42.90 kg/m  General:  Well developed, well nourished, no acute distress  Psych:  Alert and oriented,normal mood and affect HEENT:  Normocephalic, atraumatic, non-icteric sclera, PERRL, oropharynx is without mass or exudate, supple neck without adenopathy, mass or thyromegaly Cardiovascular:   RRR without gallop, rub or murmur, nondisplaced PMI, no edmea Respiratory:  Good breath sounds bilaterally, CTAB with normal respiratory effort Skin:  Warm, clear scalp; bilateral wrists with hypopigmented rash, annular lesions w/o raised borders or redness Neurologic:    Mental status is normal. Gross motor and sensory exams are normal. Normal gait  Assessment  1. Oral contraceptive pill surveillance   2. Morbid obesity (HCC) Chronic  3. Tinea versicolor      Plan   Refilled ocps. Pap up to date.   Discussed nutrition and weight loss recs.   Trial of ketoconazole.   Follow up:  cpe in 2 months  Commons side effects, risks, benefits, and alternatives for medications and treatment plan prescribed today were discussed, and the patient expressed understanding of the given instructions. Patient is instructed to call or message via MyChart if he/she has any questions or concerns regarding our treatment plan. No barriers to understanding were identified. We discussed Red Flag symptoms and signs in detail. Patient expressed understanding regarding what to do in case of urgent or emergency type symptoms.   Medication list was reconciled, printed and provided to the patient in AVS. Patient instructions and summary information was reviewed with the patient as documented in the AVS. This note was prepared with assistance of Dragon voice recognition software. Occasional wrong-word or sound-a-like substitutions may have occurred due to the inherent limitations of voice recognition software  No orders of the defined types were placed in this encounter.  Meds ordered this encounter  Medications  . NORTREL 1/35, 28, tablet    Sig: Take 1 tablet by mouth daily.    Dispense:  3 Package    Refill:  3  . ketoconazole (NIZORAL) 2 % cream    Sig: Apply 1 application topically 2 (two) times daily for 14 days. Then as needed    Dispense:  30 g    Refill:  0

## 2017-06-23 NOTE — Patient Instructions (Addendum)
It was so good seeing you again! Thank you for establishing with my new practice and allowing me to continue caring for you. It means a lot to me.   Check out the app called NOOM.   Please schedule a follow up appointment with me in July or August for your complete physical and blood work. Come fasting please.    Fat and Cholesterol Restricted Diet Getting too much fat and cholesterol in your diet may cause health problems. Following this diet helps keep your fat and cholesterol at normal levels. This can keep you from getting sick. What types of fat should I choose?  Choose monosaturated and polyunsaturated fats. These are found in foods such as olive oil, canola oil, flaxseeds, walnuts, almonds, and seeds.  Eat more omega-3 fats. Good choices include salmon, mackerel, sardines, tuna, flaxseed oil, and ground flaxseeds.  Limit saturated fats. These are in animal products such as meats, butter, and cream. They can also be in plant products such as palm oil, palm kernel oil, and coconut oil.  Avoid foods with partially hydrogenated oils in them. These contain trans fats. Examples of foods that have trans fats are stick margarine, some tub margarines, cookies, crackers, and other baked goods. What general guidelines do I need to follow?  Check food labels. Look for the words "trans fat" and "saturated fat."  When preparing a meal: ? Fill half of your plate with vegetables and green salads. ? Fill one fourth of your plate with whole grains. Look for the word "whole" as the first word in the ingredient list. ? Fill one fourth of your plate with lean protein foods.  Eat more foods that have fiber, like apples, carrots, beans, peas, and barley.  Eat more home-cooked foods. Eat less at restaurants and buffets.  Limit or avoid alcohol.  Limit foods high in starch and sugar.  Limit fried foods.  Cook foods without frying them. Baking, boiling, grilling, and broiling are all great  options.  Lose weight if you are overweight. Losing even a small amount of weight can help your overall health. It can also help prevent diseases such as diabetes and heart disease. What foods can I eat? Grains Whole grains, such as whole wheat or whole grain breads, crackers, cereals, and pasta. Unsweetened oatmeal, bulgur, barley, quinoa, or brown rice. Corn or whole wheat flour tortillas. Vegetables Fresh or frozen vegetables (raw, steamed, roasted, or grilled). Green salads. Fruits All fresh, canned (in natural juice), or frozen fruits. Meat and Other Protein Products Ground beef (85% or leaner), grass-fed beef, or beef trimmed of fat. Skinless chicken or [place]. Ground chicken or [place]. Pork trimmed of fat. All fish and seafood. Eggs. Dried beans, peas, or lentils. Unsalted nuts or seeds. Unsalted canned or dry beans. Dairy Low-fat dairy products, such as skim or 1% milk, 2% or reduced-fat cheeses, low-fat ricotta or cottage cheese, or plain low-fat yogurt. Fats and Oils Tub margarines without trans fats. Light or reduced-fat mayonnaise and salad dressings. Avocado. Olive, canola, sesame, or safflower oils. Natural peanut or almond butter (choose ones without added sugar and oil). The items listed above may not be a complete list of recommended foods or beverages. Contact your dietitian for more options. What foods are not recommended? Grains White bread. White pasta. White rice. Cornbread. Bagels, pastries, and croissants. Crackers that contain trans fat. Vegetables White potatoes. Corn. Creamed or fried vegetables. Vegetables in a cheese sauce. Fruits Dried fruits. Canned fruit in light or heavy syrup. Fruit juice.  Meat and Other Protein Products Fatty cuts of meat. Ribs, chicken wings, bacon, sausage, bologna, salami, chitterlings, fatback, hot dogs, bratwurst, and packaged luncheon meats. Liver and organ meats. Dairy Whole or 2% milk, cream, half-and-half, and cream cheese. Whole  milk cheeses. Whole-fat or sweetened yogurt. Full-fat cheeses. Nondairy creamers and whipped toppings. Processed cheese, cheese spreads, or cheese curds. Sweets and Desserts Corn syrup, sugars, honey, and molasses. Candy. Jam and jelly. Syrup. Sweetened cereals. Cookies, pies, cakes, donuts, muffins, and ice cream. Fats and Oils Butter, stick margarine, lard, shortening, ghee, or bacon fat. Coconut, palm kernel, or palm oils. Beverages Alcohol. Sweetened drinks (such as sodas, lemonade, and fruit drinks or punches). The items listed above may not be a complete list of foods and beverages to avoid. Contact your dietitian for more information. This information is not intended to replace advice given to you by your health care provider. Make sure you discuss any questions you have with your health care provider. Document Released: 07/29/2011 Document Revised: 10/04/2015 Document Reviewed: 04/28/2013 Elsevier Interactive Patient Education  2018 [place] versicolor is a common fungal infection of the skin. It causes a rash that appears as light or dark patches on the skin. The rash most often occurs on the chest, back, neck, or upper arms. This condition is more common during warm weather. Other than affecting how your skin looks, tinea versicolor usually does not cause other problems. In most cases, the infection goes away in a few weeks with treatment. It may take a few months for the patches on your skin to clear up. What are the causes? Tinea versicolor occurs when a type of fungus that is normally present on the skin starts to overgrow. This fungus is a kind of yeast. The exact cause of the overgrowth is not known. This condition cannot be passed from one person to another (noncontagious). What increases the risk? This condition is more likely to develop when certain factors are present, such as:  Heat and humidity.  Sweating too much.  Hormone  changes.  Oily skin.  A weak defense (immune) system.  What are the signs or symptoms? Symptoms of this condition may include:  A rash on your skin that is made up of light or dark patches. The rash may have: ? Patches of tan or pink spots on light skin. ? Patches of white or brown spots on dark skin. ? Patches of skin that do not tan. ? Well-marked edges. ? Scales on the discolored areas.  Mild itching.  How is this diagnosed? A health care provider can usually diagnose this condition by looking at your skin. During the exam, he or she may use ultraviolet light to help determine the extent of the infection. In some cases, a skin sample may be taken by scraping the rash. This sample will be viewed under a microscope to check for yeast overgrowth. How is this treated? Treatment for this condition may include:  Dandruff shampoo that is applied to the affected skin during showers or bathing.  Over-the-counter medicated skin cream, lotion, or soaps.  Prescription antifungal medicine in the form of skin cream or pills.  Medicine to help reduce itching.  Follow these instructions at home:  Take medicines only as directed by your health care provider.  Apply dandruff shampoo to the affected area if told to do so by your health care provider. You may be instructed to scrub the affected skin for several minutes each day.  Do not scratch the affected area of skin.  Avoid hot and humid conditions.  Do not use tanning booths.  Try to avoid sweating a lot. Contact a health care provider if:  Your symptoms get worse.  You have a fever.  You have redness, swelling, or pain at the site of your rash.  You have fluid, blood, or pus coming from your rash.  Your rash returns after treatment. This information is not intended to replace advice given to you by your health care provider. Make sure you discuss any questions you have with your health care provider. Document Released:  01/25/2000 Document Revised: 09/30/2015 Document Reviewed: 11/08/2013 Elsevier Interactive Patient Education  2018 [employer].

## 2017-06-23 NOTE — Addendum Note (Signed)
Addended by: Billey Chang on: 06/23/2017 03:13 PM   Modules accepted: Level of Service

## 2017-08-20 ENCOUNTER — Encounter (HOSPITAL_COMMUNITY): Payer: Self-pay | Admitting: Emergency Medicine

## 2017-08-20 ENCOUNTER — Ambulatory Visit (HOSPITAL_COMMUNITY)
Admission: EM | Admit: 2017-08-20 | Discharge: 2017-08-20 | Disposition: A | Discharge status: Home / Self Care | Payer: 59 | Attending: Family Medicine | Admitting: Family Medicine

## 2017-08-20 DIAGNOSIS — Z833 Family history of diabetes mellitus: Secondary | ICD-10-CM | POA: Insufficient documentation

## 2017-08-20 DIAGNOSIS — N898 Other specified noninflammatory disorders of vagina: Secondary | ICD-10-CM | POA: Diagnosis not present

## 2017-08-20 DIAGNOSIS — Z79899 Other long term (current) drug therapy: Secondary | ICD-10-CM | POA: Diagnosis not present

## 2017-08-20 DIAGNOSIS — Z8249 Family history of ischemic heart disease and other diseases of the circulatory system: Secondary | ICD-10-CM | POA: Insufficient documentation

## 2017-08-20 DIAGNOSIS — R21 Rash and other nonspecific skin eruption: Secondary | ICD-10-CM | POA: Diagnosis not present

## 2017-08-20 DIAGNOSIS — Z820 Family history of epilepsy and other diseases of the nervous system: Secondary | ICD-10-CM | POA: Diagnosis not present

## 2017-08-20 MED ORDER — FLUCONAZOLE 200 MG PO TABS: 200.0000 mg | ORAL_TABLET | Freq: Every day | ORAL | 0 refills | Status: AC

## 2017-08-20 MED ORDER — METRONIDAZOLE 500 MG PO TABS: 500.0000 mg | ORAL_TABLET | Freq: Two times a day (BID) | ORAL | 0 refills | Status: DC

## 2017-08-20 NOTE — ED Triage Notes (Signed)
Pt c/o vaginal discharge, cloudy urine, and rash on her inner thigh.

## 2017-08-20 NOTE — ED Provider Notes (Signed)
[facility]    CSN: [id number] Arrival date & time: 08/20/17  1043     History   Chief Complaint Chief Complaint  Patient presents with  . Appointment    11am  . Vaginal Discharge  . Rash    HPI Arohi Salvatierra is a 32 y.o. female.   Patient is a 32 year old female.  She presents with 3 days of vaginal itching, irritation, discharge with foul odor.  She also complains of rash to inner thighs with clear discharge.  She denies any abdominal pain, back pain, fever, chills, dysuria, hematuria, vaginal bleeding.  Last menstrual period was 1 week ago and normal.  She is on birth control pills.  She is not concerned about STDs but would like to be tested.  ROS per HPI      Past Medical History:  Diagnosis Date  . Allergy   . Chicken pox   . Eczema    On Scalp    Patient Active Problem List   Diagnosis Date Noted  . Oral contraceptive pill surveillance 12/21/2015  . Morbid obesity ([place]) 09/02/2013  . Seborrheic dermatitis of scalp 09/02/2013    Past Surgical History:  Procedure Laterality Date  . WISDOM TOOTH EXTRACTION     2013    OB History   None      Home Medications    Prior to Admission medications   Medication Sig Start Date End Date Taking? Authorizing Provider  Biotin 10 MG TABS Take by mouth.    [provider]  Ciclopirox 1 % shampoo APPLY TO SCALP AND SHAMPOO. REPEAT EVERY WEEK TO EVERY OTHER WEEK 05/07/17   [provider]  fluconazole (DIFLUCAN) 200 MG tablet Take 1 tablet (200 mg total) by mouth daily for 2 days. 08/20/17 08/22/17  Loura Halt A, NP  Fluocinolone Acetonide (DERMA-SMOOTHE/FS BODY) 0.01 % OIL Apply 1 application topically 3 (three) times a week. 09/02/13   Kennyth Arnold, FNP  loratadine (CLARITIN) 10 MG tablet Take 10 mg by mouth daily.    [provider]  metroNIDAZOLE (FLAGYL) 500 MG tablet Take 1 tablet (500 mg total) by mouth 2 (two) times daily. 08/20/17   Orvan July, NP  Multiple Vitamin  (MULTIVITAMIN WITH MINERALS) TABS Take 1 tablet by mouth daily.    [provider]  NORTREL 1/35, 28, tablet Take 1 tablet by mouth daily. 06/23/17   Leamon Arnt, MD    Family History Family History  Problem Relation Age of Onset  . Hypertension Mother   . Diabetes Mother   . Thyroid disease Mother   . Heart disease Father   . Hypertension Father   . Hypertension Maternal Grandmother   . Diabetes Maternal Grandmother   . Hypertension Paternal Grandmother   . Heart disease Paternal Grandfather   . Hypertension Paternal Grandfather   . Cerebral palsy Brother   . Seizures Brother     Social History Social History   Tobacco Use  . Smoking status: Never Smoker  . Smokeless tobacco: Never Used  Substance Use Topics  . Alcohol use: Yes    Alcohol/week: 0.6 oz    Types: 1 Glasses of wine per week    Comment: occassionally  . Drug use: No     Allergies   Patient has no known allergies.   Review of Systems Review of Systems   Physical Exam Triage Vital Signs ED Triage Vitals  Enc Vitals Group     BP 08/20/17 1117 140/63  Pulse Rate 08/20/17 1116 99     Resp 08/20/17 1116 18     Temp 08/20/17 1116 98.4 F (36.9 C)     Temp src --      SpO2 08/20/17 1116 97 %     Weight --      Height --      Head Circumference --      Peak Flow --      Pain Score 08/20/17 1117 0     Pain Loc --      Pain Edu? --      Excl. in [place]? --    No data found.  Updated Vital Signs BP 140/63   Pulse 99   Temp 98.4 F (36.9 C)   Resp 18   LMP 08/12/2017   SpO2 97%   Visual Acuity Right Eye Distance:   Left Eye Distance:   Bilateral Distance:    Right Eye Near:   Left Eye Near:    Bilateral Near:     Physical Exam  Constitutional: She is oriented to person, place, and time. She appears well-developed and well-nourished.  Eyes: Pupils are equal, round, and reactive to light. Conjunctivae are normal.  Neck: Normal range of motion.  Pulmonary/Chest: Effort  normal.  Abdominal: Soft. She exhibits no distension and no mass. There is no tenderness. There is no rebound and no guarding. No hernia.  Genitourinary: Rectum normal.    Pelvic exam was performed with patient supine. There is no rash, tenderness, lesion or injury on the right labia. There is no rash, tenderness, lesion or injury on the left labia. Cervix exhibits no motion tenderness, no discharge and no friability. Right adnexum displays no mass and no tenderness. Left adnexum displays no mass and no tenderness. There is erythema in the vagina. No bleeding in the vagina. Vaginal discharge found.  Genitourinary Comments: White/yellow milky malodorous discharge in vaginal vault  Neurological: She is alert and oriented to person, place, and time.  Skin: Skin is warm and dry. Capillary refill takes less than 2 seconds.  Psychiatric: She has a normal mood and affect.  Nursing note and vitals reviewed.    UC Treatments / Results  Labs (all labs ordered are listed, but only abnormal results are displayed) Labs Reviewed  HSV CULTURE AND TYPING  HIV ANTIBODY (ROUTINE TESTING)  RPR  CERVICOVAGINAL ANCILLARY ONLY    EKG None  Radiology No results found.  Procedures Procedures (including critical care time)  Medications Ordered in UC Medications - No data to display  Initial Impression / Assessment and Plan / UC Course  I have reviewed the triage vital signs and the nursing notes.  Pertinent labs & imaging results that were available during my care of the patient were reviewed by me and considered in my medical decision making (see chart for details).     Pelvic exam performed.  Treating for bacterial vaginosis and yeast based on exam.  Testing for HIV, syphilis, gonorrhea, chlamydia, HSV per patient request.  Rash more likely contact dermatitis but sending for culture.  Return precautions given.  Final Clinical Impressions(s) / UC Diagnoses   Final diagnoses:  Rash  Vaginal  discharge     Discharge Instructions     It was nice meeting you!!  I am treating you for a bacterial infection and yeast infection based on the exam. No alcohol when taking the flagyl this could make you very sick.  We are also testing you for STDs and will call with  the results of your tests.  The rash appears to be a contact dermatitis but we will send swab for culture.  Please follow up as needed.     ED Prescriptions    Medication Sig Dispense Auth. Provider   metroNIDAZOLE (FLAGYL) 500 MG tablet Take 1 tablet (500 mg total) by mouth 2 (two) times daily. 14 tablet Candia Kingsbury A, NP   fluconazole (DIFLUCAN) 200 MG tablet Take 1 tablet (200 mg total) by mouth daily for 2 days. 2 tablet Loura Halt A, NP     Controlled Substance Prescriptions [place] Controlled Substance Registry consulted? Not Applicable   Orvan July, NP 08/20/17 1227

## 2017-08-20 NOTE — Discharge Instructions (Signed)
It was nice meeting you!!  I am treating you for a bacterial infection and yeast infection based on the exam. No alcohol when taking the flagyl this could make you very sick.  We are also testing you for STDs and will call with the results of your tests.  The rash appears to be a contact dermatitis but we will send swab for culture.  Please follow up as needed.

## 2017-08-21 LAB — CERVICOVAGINAL ANCILLARY ONLY
Bacterial vaginitis: NEGATIVE
CHLAMYDIA, DNA PROBE: NEGATIVE
Candida vaginitis: NEGATIVE
NEISSERIA GONORRHEA: NEGATIVE
TRICH (WINDOWPATH): POSITIVE — AB

## 2017-08-21 LAB — RPR: RPR: NONREACTIVE

## 2017-08-21 LAB — HIV ANTIBODY (ROUTINE TESTING W REFLEX): HIV Screen 4th Generation wRfx: NONREACTIVE

## 2017-08-22 LAB — HSV CULTURE AND TYPING

## 2017-08-24 ENCOUNTER — Telehealth (HOSPITAL_COMMUNITY): Payer: Self-pay

## 2017-08-24 ENCOUNTER — Encounter: Payer: Self-pay | Admitting: Family Medicine

## 2017-08-24 NOTE — Telephone Encounter (Signed)
Negative hsv.  Trichomonas is positive. Rx metronidazole was given at the urgent care visit. Pt contacted and made aware, educated to please refrain from sexual intercourse for 7 days to give the medicine time to work. Sexual partners need to be notified and tested/treated. Condoms may reduce risk of reinfection. Recheck for further evaluation if symptoms are not improving. Answered all questions.

## 2017-09-08 ENCOUNTER — Encounter: Payer: Self-pay | Admitting: Family Medicine

## 2017-09-08 ENCOUNTER — Other Ambulatory Visit: Payer: Self-pay

## 2017-09-08 ENCOUNTER — Ambulatory Visit (INDEPENDENT_AMBULATORY_CARE_PROVIDER_SITE_OTHER): Payer: 59 | Admitting: Family Medicine

## 2017-09-08 VITALS — BP 124/86 | HR 100 | Temp 98.7°F | Ht 66.0 in | Wt 265.4 lb

## 2017-09-08 DIAGNOSIS — Z3041 Encounter for surveillance of contraceptive pills: Secondary | ICD-10-CM

## 2017-09-08 DIAGNOSIS — Z0000 Encounter for general adult medical examination without abnormal findings: Secondary | ICD-10-CM | POA: Diagnosis not present

## 2017-09-08 LAB — COMPREHENSIVE METABOLIC PANEL
ALK PHOS: 59 U/L (ref 39–117)
ALT: 17 U/L (ref 0–35)
AST: 16 U/L (ref 0–37)
Albumin: 3.9 g/dL (ref 3.5–5.2)
BUN: 8 mg/dL (ref 6–23)
CHLORIDE: 104 meq/L (ref 96–112)
CO2: 29 meq/L (ref 19–32)
Calcium: 9.2 mg/dL (ref 8.4–10.5)
Creatinine, Ser: 0.89 mg/dL (ref 0.40–1.20)
GFR: 94.48 mL/min (ref >60.00)
GLUCOSE: 104 mg/dL — AB (ref 70–99)
POTASSIUM: 4.2 meq/L (ref 3.5–5.1)
Sodium: 138 mEq/L (ref 135–145)
Total Bilirubin: 0.4 mg/dL (ref 0.2–1.2)
Total Protein: 6.8 g/dL (ref 6.0–8.3)

## 2017-09-08 LAB — CBC WITH DIFFERENTIAL/PLATELET
Basophils Absolute: 0.1 10*3/uL (ref 0.0–0.1)
Basophils Relative: 0.8 % (ref 0.0–3.0)
EOS PCT: 0.5 % (ref 0.0–5.0)
Eosinophils Absolute: 0 10*3/uL (ref 0.0–0.7)
HEMATOCRIT: 40.1 % (ref 36.0–46.0)
Hemoglobin: 13.2 g/dL (ref 12.0–15.0)
Lymphocytes Relative: 27.8 % (ref 12.0–46.0)
Lymphs Abs: 2.8 10*3/uL (ref 0.7–4.0)
MCHC: 32.8 g/dL (ref 30.0–36.0)
MCV: 84 fl (ref 78.0–100.0)
MONO ABS: 0.5 10*3/uL (ref 0.1–1.0)
Monocytes Relative: 4.8 % (ref 3.0–12.0)
Neutro Abs: 6.7 10*3/uL (ref 1.4–7.7)
Neutrophils Relative %: 66.1 % (ref 43.0–77.0)
Platelets: 352 10*3/uL (ref 150.0–400.0)
RBC: 4.78 Mil/uL (ref 3.87–5.11)
RDW: 14.4 % (ref 11.5–15.5)
WBC: 10.1 10*3/uL (ref 4.0–10.5)

## 2017-09-08 LAB — LIPID PANEL
Cholesterol: 115 mg/dL (ref 0–200)
HDL: 45.6 mg/dL (ref >39.00)
LDL Cholesterol: 52 mg/dL (ref 0–99)
NONHDL: 69.31
Total CHOL/HDL Ratio: 3
Triglycerides: 87 mg/dL (ref 0.0–149.0)
VLDL: 17.4 mg/dL (ref 0.0–40.0)

## 2017-09-08 NOTE — Patient Instructions (Signed)
Please return in 12 months for your annual complete physical; please come fasting.   If you have any questions or concerns, please don't hesitate to send me a message via MyChart or call the office at [phone]. Thank you for visiting with us today! It's our pleasure caring for you.  Please do these things to maintain good health!   Exercise at least 30-45 minutes a day,  4-5 days a week.   Eat a low-fat diet with lots of fruits and vegetables, up to 7-9 servings per day.  Drink plenty of water daily. Try to drink 8 8oz glasses per day.  Seatbelts can save your life. Always wear your seatbelt.  Place Smoke Detectors on every level of your home and check batteries every year.  Schedule an appointment with an eye doctor for an eye exam every 1-2 years  Safe sex - use condoms to protect yourself from STDs if you could be exposed to these types of infections. Use birth control if you do not want to become pregnant and are sexually active.  Avoid heavy alcohol use. If you drink, keep it to less than 2 drinks/day and not every day.  Health Care Power of Attorney.  Choose someone you trust that could speak for you if you became unable to speak for yourself.  Depression is common in our stressful world.If you're feeling down or losing interest in things you normally enjoy, please come in for a visit.  If anyone is threatening or hurting you, please get help. Physical or Emotional Violence is never OK.  

## 2017-09-08 NOTE — Progress Notes (Signed)
Subjective  Chief Complaint  Patient presents with  . Annual Exam    doing well, HM up to date, patient is fasting     HPI: Patricia Chase is a 32 y.o. female who presents to [place] at [facility] today for a Female Wellness Visit.   Wellness Visit: annual visit with health maintenance review and exam without Pap   Doing well. To start back at [employer] in 2 weeks. Resident director: stressed. Otherwise feeling ok. On ocps without problems.  Diet is fair. Weight is stable.   Assessment  1. Annual physical exam   2. Morbid obesity ([place])   3. Oral contraceptive pill surveillance      Plan  Female Wellness Visit:  Age appropriate Health Maintenance and Prevention measures were discussed with patient. Included topics are cancer screening recommendations, ways to keep healthy (see AVS) including dietary and exercise recommendations, regular eye and dental care, use of seat belts, and avoidance of moderate alcohol use and tobacco use.   BMI: discussed patient's BMI and encouraged positive lifestyle modifications to help get to or maintain a target BMI.  HM needs and immunizations were addressed and ordered. See below for orders. See HM and immunization section for updates.  Routine labs and screening tests ordered including cmp, cbc and lipids where appropriate.  Discussed recommendations regarding Vit D and calcium supplementation (see AVS)  Continue birth control.   Follow up: Return in about 1 year (around 09/09/2018) for complete physical.   Orders Placed This Encounter  Procedures  . CBC with Differential/Platelet  . Comprehensive metabolic panel  . Lipid panel   No orders of the defined types were placed in this encounter.     Lifestyle: Body mass index is 42.84 kg/m. Wt Readings from Last 3 Encounters:  09/08/17 265 lb 6.4 oz (120.4 kg)  06/23/17 265 lb 12.8 oz (120.6 kg)  09/02/13 259 lb 8 oz (117.7 kg)   Diet: general Exercise:  intermittently,  Need for contraception: Yes, OCP (estrogen/progesterone)  Patient Active Problem List   Diagnosis Date Noted  . Oral contraceptive pill surveillance 12/21/2015  . Morbid obesity ([place]) 09/02/2013  . Seborrheic dermatitis of scalp 09/02/2013   Health Maintenance  Topic Date Due  . INFLUENZA VACCINE  09/10/2017  . PAP SMEAR  09/20/2018  . TETANUS/TDAP  07/07/2022  . HIV Screening  Completed   Immunization History  Administered Date(s) Administered  . Tdap 07/06/2012   We updated and reviewed the patient's past history in detail and it is documented below. Allergies: Patient has No Known Allergies. Past Medical History Patient  has a past medical history of Allergy, Chicken pox, and Eczema. Past Surgical History Patient  has a past surgical history that includes Wisdom tooth extraction. Family History: Patient family history includes Cerebral palsy in her brother; Diabetes in her maternal grandmother and mother; Heart disease in her father and paternal grandfather; Hypertension in her father, maternal grandmother, mother, paternal grandfather, and paternal grandmother; Seizures in her brother; Thyroid disease in her mother. Social History:  Patient  reports that she has never smoked. She has never used smokeless tobacco. She reports that she drinks about 0.6 oz of alcohol per week. She reports that she does not use drugs.  Review of Systems: Constitutional: negative for fever or malaise Ophthalmic: negative for photophobia, double vision or loss of vision Cardiovascular: negative for chest pain, dyspnea on exertion, or new LE swelling Respiratory: negative for SOB or persistent cough Gastrointestinal: negative for abdominal  pain, change in bowel habits or melena Genitourinary: negative for dysuria or gross hematuria, no abnormal uterine bleeding or disharge Musculoskeletal: negative for new gait disturbance or muscular weakness Integumentary: negative for new or  persistent rashes, no breast lumps Neurological: negative for TIA or stroke symptoms Psychiatric: negative for SI or delusions Allergic/Immunologic: negative for hives Patient Care Team    Relationship Specialty Notifications Start End  Leamon Arnt, MD PCP - General Family Medicine  06/23/17   Loney Loh, MD  Dermatology  06/23/17     Objective  Vitals: BP 124/86   Pulse 100   Temp 98.7 F (37.1 C)   Ht 5\' 6"  (1.676 m)   Wt 265 lb 6.4 oz (120.4 kg)   LMP 08/12/2017   SpO2 98%   BMI 42.84 kg/m  General:  Well developed, well nourished, no acute distress  Psych:  Alert and orientedx3,normal mood and affect HEENT:  Normocephalic, atraumatic, non-icteric sclera, PERRL, oropharynx is clear without mass or exudate, supple neck without adenopathy, mass or thyromegaly Cardiovascular:  Normal S1, S2, RRR without gallop, rub or murmur, nondisplaced PMI Respiratory:  Good breath sounds bilaterally, CTAB with normal respiratory effort Gastrointestinal: normal bowel sounds, soft, non-tender, no noted masses. No HSM MSK: no deformities, contusions. Joints are without erythema or swelling. Spine and CVA region are nontender Skin:  Warm, no rashes or suspicious lesions noted Neurologic:    Mental status is normal. CN 2-11 are normal. Gross motor and sensory exams are normal. Normal gait. No tremor Breast Exam: No mass, skin retraction or nipple discharge is appreciated in either breast. No axillary adenopathy. Fibrocystic changes are not noted   Commons side effects, risks, benefits, and alternatives for medications and treatment plan prescribed today were discussed, and the patient expressed understanding of the given instructions. Patient is instructed to call or message via MyChart if he/she has any questions or concerns regarding our treatment plan. No barriers to understanding were identified. We discussed Red Flag symptoms and signs in detail. Patient expressed understanding regarding  what to do in case of urgent or emergency type symptoms.   Medication list was reconciled, printed and provided to the patient in AVS. Patient instructions and summary information was reviewed with the patient as documented in the AVS. This note was prepared with assistance of Dragon voice recognition software. Occasional wrong-word or sound-a-like substitutions may have occurred due to the inherent limitations of voice recognition software

## 2017-12-31 DIAGNOSIS — L219 Seborrheic dermatitis, unspecified: Secondary | ICD-10-CM | POA: Diagnosis not present

## 2017-12-31 DIAGNOSIS — L299 Pruritus, unspecified: Secondary | ICD-10-CM | POA: Diagnosis not present

## 2018-06-12 ENCOUNTER — Other Ambulatory Visit: Payer: Self-pay | Admitting: Family Medicine

## 2018-07-13 ENCOUNTER — Ambulatory Visit (INDEPENDENT_AMBULATORY_CARE_PROVIDER_SITE_OTHER): Payer: BC Managed Care – PPO | Admitting: Family Medicine

## 2018-07-13 ENCOUNTER — Encounter: Payer: Self-pay | Admitting: Family Medicine

## 2018-07-13 DIAGNOSIS — B360 Pityriasis versicolor: Secondary | ICD-10-CM

## 2018-07-13 DIAGNOSIS — L853 Xerosis cutis: Secondary | ICD-10-CM | POA: Diagnosis not present

## 2018-07-13 MED ORDER — KETOCONAZOLE 2 % EX CREA: 1.0000 "application " | TOPICAL_CREAM | Freq: Two times a day (BID) | CUTANEOUS | 1 refills | Status: AC

## 2018-07-13 NOTE — Progress Notes (Signed)
Virtual Visit via Video Note  Subjective  CC:  Chief Complaint  Patient presents with  . Rash    arms and legs x 2 weeks     I connected with [name] on 07/13/18 at  8:00 AM EDT by a video enabled telemedicine application and verified that I am speaking with the correct person using two identifiers. Location patient: Home Location provider: [facility] [place], Office Persons participating in the virtual visit: [name], Leamon Arnt, MD Lilli Light, [place] discussed the limitations of evaluation and management by telemedicine and the availability of in person appointments. The patient expressed understanding and agreed to proceed. HPI: Keishawn Darsey is a 33 y.o. female who was contacted today to address the problems listed above in the chief complaint. . 33 yo w/ h/o tinea versicolor and seb derm of scalp who sees derm has recurrent rash on wrists and lower ext x several weeks. Some itching. No raised rash, describes small light areas on skin. No flaking or blistering. No pain. No systemic sxs. Has used cream before that helped. Saw me 06/2017 with "same rash". I reviewed that note.  Assessment  1. Tinea versicolor   2. Xerosis cutis      Plan   Tinea versicolor:  Reeducated. Restart ketoconazole cream bid.   Dry skin: discussed skin care and moisturizers.  I discussed the assessment and treatment plan with the patient. The patient was provided an opportunity to ask questions and all were answered. The patient agreed with the plan and demonstrated an understanding of the instructions.   The patient was advised to call back or seek an in-person evaluation if the symptoms worsen or if the condition fails to improve as anticipated. Follow up: Return for as scheduled.  09/10/2018 for cpe  Meds ordered this encounter  Medications  . ketoconazole (NIZORAL) 2 % cream    Sig: Apply 1 application topically 2 (two) times daily for 7 days. Then as  needed    Dispense:  30 g    Refill:  1      I reviewed the patients updated PMH, FH, and SocHx.    Patient Active Problem List   Diagnosis Date Noted  . Tinea versicolor 07/13/2018  . Oral contraceptive pill surveillance 12/21/2015  . Morbid obesity ([place]) 09/02/2013  . Seborrheic dermatitis of scalp 09/02/2013   Current Meds  Medication Sig  . Biotin 10 MG TABS Take by mouth.  . Ciclopirox 1 % shampoo APPLY TO SCALP AND SHAMPOO. REPEAT EVERY WEEK TO EVERY OTHER WEEK  . Fluocinolone Acetonide (DERMA-SMOOTHE/FS BODY) 0.01 % OIL Apply 1 application topically 3 (three) times a week.  . loratadine (CLARITIN) 10 MG tablet Take 10 mg by mouth daily.  . Multiple Vitamin (MULTIVITAMIN WITH MINERALS) TABS Take 1 tablet by mouth daily.  Marland Kitchen NORTREL 1/35, 28, tablet TAKE 1 TABLET BY MOUTH DAILY  . [DISCONTINUED] ketoconazole (NIZORAL) 2 % cream APPLY TOPICALLY 2 TIMES DAILY FOR 14 DAYS THEN AS NEEDED    Allergies: Patient has No Known Allergies. Family History: Patient family history includes Cerebral palsy in her brother; Diabetes in her maternal grandmother and mother; Heart disease in her father and paternal grandfather; Hypertension in her father, maternal grandmother, mother, paternal grandfather, and paternal grandmother; Seizures in her brother; Thyroid disease in her mother. Social History:  Patient  reports that she has never smoked. She has never used smokeless tobacco. She reports current alcohol use of  about 1.0 standard drinks of alcohol per week. She reports that she does not use drugs.  Review of Systems: Constitutional: Negative for fever malaise or anorexia Cardiovascular: negative for chest pain Respiratory: negative for SOB or persistent cough Gastrointestinal: negative for abdominal pain  OBJECTIVE Vitals: LMP 06/16/2018  General: no acute distress , A&Ox3 Forearms: hard to see well on video but hypopigmented irregular rash present. No obvious erythema.   Leamon Arnt, MD

## 2018-09-02 ENCOUNTER — Other Ambulatory Visit: Payer: Self-pay | Admitting: Family Medicine

## 2018-09-02 NOTE — Telephone Encounter (Signed)
09/14/2018 has cpe appt

## 2018-09-10 ENCOUNTER — Encounter: Payer: 59 | Admitting: Family Medicine

## 2018-09-14 ENCOUNTER — Encounter: Payer: BC Managed Care – PPO | Admitting: Family Medicine

## 2018-10-04 ENCOUNTER — Other Ambulatory Visit (HOSPITAL_COMMUNITY)
Admission: RE | Admit: 2018-10-04 | Discharge: 2018-10-04 | Disposition: A | Discharge status: Home / Self Care | Payer: BC Managed Care – PPO | Source: Ambulatory Visit | Attending: Family Medicine | Admitting: Family Medicine

## 2018-10-04 ENCOUNTER — Other Ambulatory Visit: Payer: Self-pay

## 2018-10-04 ENCOUNTER — Ambulatory Visit (INDEPENDENT_AMBULATORY_CARE_PROVIDER_SITE_OTHER): Payer: BC Managed Care – PPO | Admitting: Family Medicine

## 2018-10-04 ENCOUNTER — Encounter: Payer: Self-pay | Admitting: Family Medicine

## 2018-10-04 VITALS — BP 128/76 | HR 104 | Temp 97.3°F | Resp 16 | Ht 66.0 in | Wt 263.6 lb

## 2018-10-04 DIAGNOSIS — Z0000 Encounter for general adult medical examination without abnormal findings: Secondary | ICD-10-CM | POA: Diagnosis not present

## 2018-10-04 DIAGNOSIS — Z124 Encounter for screening for malignant neoplasm of cervix: Secondary | ICD-10-CM | POA: Diagnosis not present

## 2018-10-04 DIAGNOSIS — Z3041 Encounter for surveillance of contraceptive pills: Secondary | ICD-10-CM

## 2018-10-04 NOTE — Patient Instructions (Signed)
Please return in 12 months for your annual complete physical; please come fasting.  I will release your lab results to you on your MyChart account with further instructions. Please reply with any questions.    If you have any questions or concerns, please don't hesitate to send me a message via MyChart or call the office at [phone]. Thank you for visiting with [place] today! It's our pleasure caring for you.   Preventive Care 70-33 Years Old, Female Preventive care refers to visits with your health care provider and lifestyle choices that can promote health and wellness. This includes:  A yearly physical exam. This may also be called an annual well check.  Regular dental visits and eye exams.  Immunizations.  Screening for certain conditions.  Healthy lifestyle choices, such as eating a healthy diet, getting regular exercise, not using drugs or products that contain nicotine and tobacco, and limiting alcohol use. What can I expect for my preventive care visit? Physical exam Your health care provider will check your:  Height and weight. This may be used to calculate body mass index (BMI), which tells if you are at a healthy weight.  Heart rate and blood pressure.  Skin for abnormal spots. Counseling Your health care provider may ask you questions about your:  Alcohol, tobacco, and drug use.  Emotional well-being.  Home and relationship well-being.  Sexual activity.  Eating habits.  Work and work Statistician.  Method of birth control.  Menstrual cycle.  Pregnancy history. What immunizations do I need?  Influenza (flu) vaccine  This is recommended every year. Tetanus, diphtheria, and pertussis (Tdap) vaccine  You may need a Td booster every 10 years. Varicella (chickenpox) vaccine  You may need this if you have not been vaccinated. Human papillomavirus (HPV) vaccine  If recommended by your health care provider, you may need three doses over 6 months.  Measles, mumps, and rubella (MMR) vaccine  You may need at least one dose of MMR. You may also need a second dose. Meningococcal conjugate (MenACWY) vaccine  One dose is recommended if you are age 74-21 years and a first-year college student living in a residence hall, or if you have one of several medical conditions. You may also need additional booster doses. Pneumococcal conjugate (PCV13) vaccine  You may need this if you have certain conditions and were not previously vaccinated. Pneumococcal polysaccharide (PPSV23) vaccine  You may need one or two doses if you smoke cigarettes or if you have certain conditions. Hepatitis A vaccine  You may need this if you have certain conditions or if you travel or work in places where you may be exposed to hepatitis A. Hepatitis B vaccine  You may need this if you have certain conditions or if you travel or work in places where you may be exposed to hepatitis B. Haemophilus influenzae type b (Hib) vaccine  You may need this if you have certain conditions. You may receive vaccines as individual doses or as more than one vaccine together in one shot (combination vaccines). Talk with your health care provider about the risks and benefits of combination vaccines. What tests do I need?  Blood tests  Lipid and cholesterol levels. These may be checked every 5 years starting at age 62.  Hepatitis C test.  Hepatitis B test. Screening  Diabetes screening. This is done by checking your blood sugar (glucose) after you have not eaten for a while (fasting).  Sexually transmitted disease (STD) testing.  BRCA-related cancer screening. This may  be done if you have a family history of breast, ovarian, tubal, or peritoneal cancers.  Pelvic exam and Pap test. This may be done every 3 years starting at age 14. Starting at age 59, this may be done every 5 years if you have a Pap test in combination with an HPV test. Talk with your health care provider about  your test results, treatment options, and if necessary, the need for more tests. Follow these instructions at home: Eating and drinking   Eat a diet that includes fresh fruits and vegetables, whole grains, lean protein, and low-fat dairy.  Take vitamin and mineral supplements as recommended by your health care provider.  Do not drink alcohol if: ? Your health care provider tells you not to drink. ? You are pregnant, may be pregnant, or are planning to become pregnant.  If you drink alcohol: ? Limit how much you have to 0-1 drink a day. ? Be aware of how much alcohol is in your drink. In the [place] one drink equals one 12 oz bottle of beer (355 mL), one 5 oz glass of wine (148 mL), or one 1 oz glass of hard liquor (44 mL). Lifestyle  Take daily care of your teeth and gums.  Stay active. Exercise for at least 30 minutes on 5 or more days each week.  Do not use any products that contain nicotine or tobacco, such as cigarettes, e-cigarettes, and chewing tobacco. If you need help quitting, ask your health care provider.  If you are sexually active, practice safe sex. Use a condom or other form of birth control (contraception) in order to prevent pregnancy and STIs (sexually transmitted infections). If you plan to become pregnant, see your health care provider for a preconception visit. What's next?  Visit your health care provider once a year for a well check visit.  Ask your health care provider how often you should have your eyes and teeth checked.  Stay up to date on all vaccines. This information is not intended to replace advice given to you by your health care provider. Make sure you discuss any questions you have with your health care provider. Document Released: 03/25/2001 Document Revised: 10/08/2017 Document Reviewed: 10/08/2017 Elsevier Patient Education  2020 [employer].

## 2018-10-04 NOTE — Progress Notes (Signed)
Subjective  Chief Complaint  Patient presents with  . Annual Exam    Pap Smeear    HPI: Patricia Chase is a 33 y.o. female who presents to [place] at [place] today for a Female Wellness Visit.   Wellness Visit: annual visit with health maintenance review and exam with Pap   33 yo single female on ocps doing well. Much happier at work: no longer resident [employer] so doesn't have to live on campus at [employer]. No concerns. Considering stopping ocps while not sexually active.   Declines flu vaccine.   Assessment  1. Annual physical exam   2. Morbid obesity ([place])   3. Oral contraceptive pill surveillance   4. Cervical cancer screening      Plan  Female Wellness Visit:  Age appropriate Health Maintenance and Prevention measures were discussed with patient. Included topics are cancer screening recommendations, ways to keep healthy (see AVS) including dietary and exercise recommendations, regular eye and dental care, use of seat belts, and avoidance of moderate alcohol use and tobacco use.   BMI: discussed patient's BMI and encouraged positive lifestyle modifications to help get to or maintain a target BMI.  HM needs and immunizations were addressed and ordered. See below for orders. See HM and immunization section for updates.  Routine labs and screening tests ordered including cmp, cbc and lipids where appropriate.  Discussed recommendations regarding Vit D and calcium supplementation (see AVS)  Follow up: Return in about 1 year (around 10/04/2019) for complete physical.   Orders Placed This Encounter  Procedures  . CBC with Differential/Platelet  . Comprehensive metabolic panel  . Lipid panel   No orders of the defined types were placed in this encounter.     Lifestyle: Body mass index is 42.55 kg/m. Wt Readings from Last 3 Encounters:  10/04/18 263 lb 9.6 oz (119.6 kg)  09/08/17 265 lb 6.4 oz (120.4 kg)  06/23/17 265 lb 12.8 oz (120.6 kg)   Diet:  general Exercise: intermittently,  Need for contraception: No, OCP (estrogen/progesterone)  Patient Active Problem List   Diagnosis Date Noted  . Tinea versicolor 07/13/2018  . Oral contraceptive pill surveillance 12/21/2015  . Morbid obesity ([place]) 09/02/2013  . Seborrheic dermatitis of scalp 09/02/2013   Health Maintenance  Topic Date Due  . INFLUENZA VACCINE  09/11/2018  . PAP SMEAR-Modifier  09/20/2018  . TETANUS/TDAP  07/07/2022  . HIV Screening  Completed   Immunization History  Administered Date(s) Administered  . Tdap 07/06/2012   We updated and reviewed the patient's past history in detail and it is documented below. Allergies: Patient has No Known Allergies. Past Medical History Patient  has a past medical history of Allergy, Chicken pox, and Eczema. Past Surgical History Patient  has a past surgical history that includes Wisdom tooth extraction. Family History: Patient family history includes Cerebral palsy in her brother; Diabetes in her maternal grandmother and mother; Heart disease in her father and paternal grandfather; Hypertension in her father, maternal grandmother, mother, paternal grandfather, and paternal grandmother; Seizures in her brother; Thyroid disease in her mother. Social History:  Patient  reports that she has never smoked. She has never used smokeless tobacco. She reports current alcohol use of about 1.0 standard drinks of alcohol per week. She reports that she does not use drugs.  Review of Systems: Constitutional: negative for fever or malaise Ophthalmic: negative for photophobia, double vision or loss of vision Cardiovascular: negative for chest pain, dyspnea on exertion, or new LE  swelling Respiratory: negative for SOB or persistent cough Gastrointestinal: negative for abdominal pain, change in bowel habits or melena Genitourinary: negative for dysuria or gross hematuria, no abnormal uterine bleeding or disharge Musculoskeletal: negative for  new gait disturbance or muscular weakness Integumentary: negative for new or persistent rashes, no breast lumps Neurological: negative for TIA or stroke symptoms Psychiatric: negative for SI or delusions Allergic/Immunologic: negative for hives Patient Care Team    Relationship Specialty Notifications Start End  Leamon Arnt, MD PCP - General Family Medicine  06/23/17   Loney Loh, MD  Dermatology  06/23/17     Objective  Vitals: BP 128/76   Pulse (!) 104   Temp (!) 97.3 F (36.3 C) (Tympanic)   Resp 16   Ht 5\' 6"  (1.676 m)   Wt 263 lb 9.6 oz (119.6 kg)   LMP 09/08/2018   SpO2 98%   BMI 42.55 kg/m  General:  Well developed, well nourished, no acute distress  Psych:  Alert and orientedx3,normal mood and affect HEENT:  Normocephalic, atraumatic, non-icteric sclera, PERRL, oropharynx is clear without mass or exudate, supple neck without adenopathy, mass or thyromegaly Cardiovascular:  Normal S1, S2, RRR without gallop, rub or murmur, nondisplaced PMI Respiratory:  Good breath sounds bilaterally, CTAB with normal respiratory effort Gastrointestinal: normal bowel sounds, soft, non-tender, no noted masses. No HSM MSK: no deformities, contusions. Joints are without erythema or swelling. Spine and CVA region are nontender Skin:  Warm, no rashes or suspicious lesions noted Neurologic:    Mental status is normal. CN 2-11 are normal. Gross motor and sensory exams are normal. Normal gait. No tremor Breast Exam: No mass, skin retraction or nipple discharge is appreciated in either breast. No axillary adenopathy. Fibrocystic changes are not noted Pelvic Exam: Normal external genitalia, no vulvar or vaginal lesions present. Clear cervix w/o CMT. Bimanual exam reveals a nontender fundus w/o masses, nl size. No adnexal masses present. No inguinal adenopathy. A PAP smear was performed.    Commons side effects, risks, benefits, and alternatives for medications and treatment plan prescribed  today were discussed, and the patient expressed understanding of the given instructions. Patient is instructed to call or message via MyChart if he/she has any questions or concerns regarding our treatment plan. No barriers to understanding were identified. We discussed Red Flag symptoms and signs in detail. Patient expressed understanding regarding what to do in case of urgent or emergency type symptoms.   Medication list was reconciled, printed and provided to the patient in AVS. Patient instructions and summary information was reviewed with the patient as documented in the AVS. This note was prepared with assistance of Dragon voice recognition software. Occasional wrong-word or sound-a-like substitutions may have occurred due to the inherent limitations of voice recognition software

## 2018-10-05 LAB — LIPID PANEL
Cholesterol: 113 mg/dL (ref 0–200)
HDL: 41.8 mg/dL (ref >39.00)
LDL Cholesterol: 53 mg/dL (ref 0–99)
NonHDL: 70.92
Total CHOL/HDL Ratio: 3
Triglycerides: 88 mg/dL (ref 0.0–149.0)
VLDL: 17.6 mg/dL (ref 0.0–40.0)

## 2018-10-05 LAB — CBC WITH DIFFERENTIAL/PLATELET
Basophils Absolute: 0.1 10*3/uL (ref 0.0–0.1)
Basophils Relative: 1.2 % (ref 0.0–3.0)
Eosinophils Absolute: 0 10*3/uL (ref 0.0–0.7)
Eosinophils Relative: 0.3 % (ref 0.0–5.0)
HCT: 38.7 % (ref 36.0–46.0)
Hemoglobin: 12.8 g/dL (ref 12.0–15.0)
Lymphocytes Relative: 34.4 % (ref 12.0–46.0)
Lymphs Abs: 3.1 10*3/uL (ref 0.7–4.0)
MCHC: 33.1 g/dL (ref 30.0–36.0)
MCV: 84.8 fl (ref 78.0–100.0)
Monocytes Absolute: 0.6 10*3/uL (ref 0.1–1.0)
Monocytes Relative: 6.2 % (ref 3.0–12.0)
Neutro Abs: 5.2 10*3/uL (ref 1.4–7.7)
Neutrophils Relative %: 57.9 % (ref 43.0–77.0)
Platelets: 349 10*3/uL (ref 150.0–400.0)
RBC: 4.57 Mil/uL (ref 3.87–5.11)
RDW: 13.6 % (ref 11.5–15.5)
WBC: 9 10*3/uL (ref 4.0–10.5)

## 2018-10-05 LAB — COMPREHENSIVE METABOLIC PANEL
ALT: 30 U/L (ref 0–35)
AST: 20 U/L (ref 0–37)
Albumin: 4.1 g/dL (ref 3.5–5.2)
Alkaline Phosphatase: 60 U/L (ref 39–117)
BUN: 10 mg/dL (ref 6–23)
CO2: 26 mEq/L (ref 19–32)
Calcium: 9 mg/dL (ref 8.4–10.5)
Chloride: 104 mEq/L (ref 96–112)
Creatinine, Ser: 0.87 mg/dL (ref 0.40–1.20)
GFR: 90.65 mL/min (ref >60.00)
Glucose, Bld: 99 mg/dL (ref 70–99)
Potassium: 4.2 mEq/L (ref 3.5–5.1)
Sodium: 138 mEq/L (ref 135–145)
Total Bilirubin: 0.2 mg/dL (ref 0.2–1.2)
Total Protein: 6.6 g/dL (ref 6.0–8.3)

## 2018-10-06 LAB — CYTOLOGY - PAP
Diagnosis: NEGATIVE
HPV: NOT DETECTED

## 2019-02-07 ENCOUNTER — Encounter (HOSPITAL_COMMUNITY): Payer: Self-pay

## 2019-02-07 ENCOUNTER — Other Ambulatory Visit: Payer: Self-pay

## 2019-02-07 ENCOUNTER — Ambulatory Visit (HOSPITAL_COMMUNITY)
Admission: EM | Admit: 2019-02-07 | Discharge: 2019-02-07 | Disposition: A | Discharge status: Home / Self Care | Payer: BC Managed Care – PPO

## 2019-02-07 DIAGNOSIS — T148XXA Other injury of unspecified body region, initial encounter: Secondary | ICD-10-CM | POA: Diagnosis not present

## 2019-02-07 NOTE — ED Triage Notes (Signed)
Patient presents to Urgent Care with complaints of lower back pain w/ lifting and sitting in certain positions since the past few weeks. Patient reports she has been taking tylenol and using biofreeze.

## 2019-02-07 NOTE — Discharge Instructions (Addendum)
Based on your symptoms it seems like this may be just a muscle strain. You can try doing ibuprofen as needed for the pain and inflammation. You  could do gentle stretching to the area and heat If your symptoms continue or worsen over the next few weeks you need to follow-up for x-ray. Avoid strenuous exercising until you are feeling better

## 2019-02-08 NOTE — ED Provider Notes (Addendum)
[facility]    CSN: [id number] Arrival date & time: 02/07/19  1156      History   Chief Complaint Chief Complaint  Patient presents with  . Appointment    1200  . Back Pain    HPI Patricia Chase is a 33 y.o. female.   Patient is a 33 year old female presents today with lower back pain with lifting and sitting in certain positions.  This has been going on for the past few weeks.  She has also been doing vigorous exercise at the gym.  Patient reports she has been taking Tylenol and using Biofreeze with some relief.  The problem has been waxing, waning.  She denies any dysuria, hematuria or urinary frequency.  Denies any abdominal pain, vaginal discharge or fevers.  No numbness, tingling or weakness.  ROS per HPI    Back Pain   Past Medical History:  Diagnosis Date  . Allergy   . Chicken pox   . Eczema    On Scalp    Patient Active Problem List   Diagnosis Date Noted  . Tinea versicolor 07/13/2018  . Oral contraceptive pill surveillance 12/21/2015  . Morbid obesity ([place]) 09/02/2013  . Seborrheic dermatitis of scalp 09/02/2013    Past Surgical History:  Procedure Laterality Date  . WISDOM TOOTH EXTRACTION     2013    OB History   No obstetric history on file.      Home Medications    Prior to Admission medications   Medication Sig Start Date End Date Taking? Authorizing Provider  Ciclopirox 1 % shampoo APPLY TO SCALP AND SHAMPOO. REPEAT EVERY WEEK TO EVERY OTHER WEEK 05/07/17   [provider]  Fluocinolone Acetonide (DERMA-SMOOTHE/FS BODY) 0.01 % OIL Apply 1 application topically 3 (three) times a week. 09/02/13   Kennyth Arnold, FNP  loratadine (CLARITIN) 10 MG tablet Take 10 mg by mouth daily.    [provider]  Multiple Vitamin (MULTIVITAMIN WITH MINERALS) TABS Take 1 tablet by mouth daily.    [provider]  Green 1/35, 28, tablet TAKE 1 TABLET BY MOUTH ONCE DAILY 09/02/18   Leamon Arnt, MD    Family  History Family History  Problem Relation Age of Onset  . Hypertension Mother   . Diabetes Mother   . Thyroid disease Mother   . Heart disease Father   . Hypertension Father   . Hypertension Maternal Grandmother   . Diabetes Maternal Grandmother   . Hypertension Paternal Grandmother   . Heart disease Paternal Grandfather   . Hypertension Paternal Grandfather   . Cerebral palsy Brother   . Seizures Brother     Social History Social History   Tobacco Use  . Smoking status: Never Smoker  . Smokeless tobacco: Never Used  Substance Use Topics  . Alcohol use: Yes    Alcohol/week: 1.0 standard drinks    Types: 1 Glasses of wine per week    Comment: occassionally  . Drug use: No     Allergies   Patient has no known allergies.   Review of Systems Review of Systems  Musculoskeletal: Positive for back pain.     Physical Exam Triage Vital Signs ED Triage Vitals  Enc Vitals Group     BP 02/07/19 1219 133/62     Pulse Rate 02/07/19 1219 89     Resp 02/07/19 1219 16     Temp 02/07/19 1219 98.6 F (37 C)     Temp Source 02/07/19 1219 Oral  SpO2 02/07/19 1219 99 %     Weight --      Height --      Head Circumference --      Peak Flow --      Pain Score 02/07/19 1218 7     Pain Loc --      Pain Edu? --      Excl. in [place]? --    No data found.  Updated Vital Signs BP 133/62 (BP Location: Right Arm)   Pulse 89   Temp 98.6 F (37 C) (Oral)   Resp 16   SpO2 99%   Visual Acuity Right Eye Distance:   Left Eye Distance:   Bilateral Distance:    Right Eye Near:   Left Eye Near:    Bilateral Near:     Physical Exam Vitals and nursing note reviewed.  Constitutional:      General: She is not in acute distress.    Appearance: Normal appearance. She is not ill-appearing, toxic-appearing or diaphoretic.  HENT:     Head: Normocephalic.     Nose: Nose normal.     Mouth/Throat:     Pharynx: Oropharynx is clear.  Eyes:     Conjunctiva/sclera: Conjunctivae  normal.  Pulmonary:     Effort: Pulmonary effort is normal.  Abdominal:     Palpations: Abdomen is soft.     Tenderness: There is no abdominal tenderness.  Musculoskeletal:        General: No tenderness. Normal range of motion.     Cervical back: Normal range of motion.     Comments: Nontender to palpation of entire back No bruising, swelling or deformities  Skin:    General: Skin is warm and dry.     Findings: No rash.  Neurological:     Mental Status: She is alert.  Psychiatric:        Mood and Affect: Mood normal.      UC Treatments / Results  Labs (all labs ordered are listed, but only abnormal results are displayed) Labs Reviewed - No data to display  EKG   Radiology No results found.  Procedures Procedures (including critical care time)  Medications Ordered in UC Medications - No data to display  Initial Impression / Assessment and Plan / UC Course  I have reviewed the triage vital signs and the nursing notes.  Pertinent labs & imaging results that were available during my care of the patient were reviewed by me and considered in my medical decision making (see chart for details).     Muscle strain-this is most likely the cause of her symptoms or overuse of the muscles. The pain was not reproducible on exam but reports she felt it when sitting and bending over forward. Recommended rest, ice/heat and she can try ibuprofen as needed for the pain Gentle stretching or massage Recommended if symptoms worsens you can follow-up for x-ray at that point. Final Clinical Impressions(s) / UC Diagnoses   Final diagnoses:  Muscle strain     Discharge Instructions     Based on your symptoms it seems like this may be just a muscle strain. You can try doing ibuprofen as needed for the pain and inflammation. You  could do gentle stretching to the area and heat If your symptoms continue or worsen over the next few weeks you need to follow-up for x-ray. Avoid  strenuous exercising until you are feeling better     ED Prescriptions    None  PDMP not reviewed this encounter.        Orvan July, NP 02/08/19 1644

## 2019-10-05 ENCOUNTER — Other Ambulatory Visit: Payer: Self-pay

## 2019-10-05 ENCOUNTER — Ambulatory Visit (INDEPENDENT_AMBULATORY_CARE_PROVIDER_SITE_OTHER): Payer: BC Managed Care – PPO | Admitting: Family Medicine

## 2019-10-05 ENCOUNTER — Encounter: Payer: Self-pay | Admitting: Family Medicine

## 2019-10-05 VITALS — BP 124/72 | HR 94 | Temp 97.9°F | Resp 18 | Ht 66.0 in | Wt 277.4 lb

## 2019-10-05 DIAGNOSIS — Z0000 Encounter for general adult medical examination without abnormal findings: Secondary | ICD-10-CM

## 2019-10-05 DIAGNOSIS — L219 Seborrheic dermatitis, unspecified: Secondary | ICD-10-CM

## 2019-10-05 LAB — COMPREHENSIVE METABOLIC PANEL
AG Ratio: 1.8 (calc) (ref 1.0–2.5)
ALT: 17 U/L (ref 6–29)
AST: 18 U/L (ref 10–30)
Albumin: 4.2 g/dL (ref 3.6–5.1)
Alkaline phosphatase (APISO): 69 U/L (ref 31–125)
BUN: 10 mg/dL (ref 7–25)
CO2: 25 mmol/L (ref 20–32)
Calcium: 9 mg/dL (ref 8.6–10.2)
Chloride: 106 mmol/L (ref 98–110)
Creat: 0.8 mg/dL (ref 0.50–1.10)
Globulin: 2.3 g/dL (calc) (ref 1.9–3.7)
Glucose, Bld: 103 mg/dL — ABNORMAL HIGH (ref 65–99)
Potassium: 4.4 mmol/L (ref 3.5–5.3)
Sodium: 140 mmol/L (ref 135–146)
Total Bilirubin: 0.3 mg/dL (ref 0.2–1.2)
Total Protein: 6.5 g/dL (ref 6.1–8.1)

## 2019-10-05 LAB — LIPID PANEL
Cholesterol: 126 mg/dL (ref <200)
HDL: 55 mg/dL (ref >=50)
LDL Cholesterol (Calc): 57 mg/dL (calc)
Non-HDL Cholesterol (Calc): 71 mg/dL (calc) (ref <130)
Total CHOL/HDL Ratio: 2.3 (calc) (ref <5.0)
Triglycerides: 55 mg/dL (ref <150)

## 2019-10-05 LAB — CBC WITH DIFFERENTIAL/PLATELET
Absolute Monocytes: 510 cells/uL (ref 200–950)
Basophils Absolute: 62 cells/uL (ref 0–200)
Basophils Relative: 0.7 %
Eosinophils Absolute: 26 cells/uL (ref 15–500)
Eosinophils Relative: 0.3 %
HCT: 36.3 % (ref 35.0–45.0)
Hemoglobin: 11.6 g/dL — ABNORMAL LOW (ref 11.7–15.5)
Lymphs Abs: 2631 cells/uL (ref 850–3900)
MCH: 26.2 pg — ABNORMAL LOW (ref 27.0–33.0)
MCHC: 32 g/dL (ref 32.0–36.0)
MCV: 82.1 fL (ref 80.0–100.0)
MPV: 10.2 fL (ref 7.5–12.5)
Monocytes Relative: 5.8 %
Neutro Abs: 5570 cells/uL (ref 1500–7800)
Neutrophils Relative %: 63.3 %
Platelets: 363 10*3/uL (ref 140–400)
RBC: 4.42 10*6/uL (ref 3.80–5.10)
RDW: 14.3 % (ref 11.0–15.0)
Total Lymphocyte: 29.9 %
WBC: 8.8 10*3/uL (ref 3.8–10.8)

## 2019-10-05 LAB — TSH: TSH: 2.72 mIU/L

## 2019-10-05 NOTE — Progress Notes (Signed)
Subjective  Chief Complaint  Patient presents with   Annual Exam    Fasting labs. No concerns or changes    HPI: Patricia Chase is a 34 y.o. female who presents to [facility] today for a Female Wellness Visit.   Wellness Visit: annual visit with health maintenance review and exam without Pap   HM: all up to date. Happy and healthy. Back to work in person and is busy: [employer]. Likes it. Not dating. No longer on ocps. Menses are regular. No concerns.   Assessment  1. Annual physical exam   2. Morbid obesity ([place])   3. Seborrheic dermatitis of scalp      Plan  Female Wellness Visit:  Age appropriate Health Maintenance and Prevention measures were discussed with patient. Included topics are cancer screening recommendations, ways to keep healthy (see AVS) including dietary and exercise recommendations, regular eye and dental care, use of seat belts, and avoidance of moderate alcohol use and tobacco use.   BMI: discussed patient's BMI and encouraged positive lifestyle modifications to help get to or maintain a target BMI.  HM needs and immunizations were addressed and ordered. See below for orders. See HM and immunization section for updates.  Routine labs and screening tests ordered including cmp, cbc and lipids where appropriate.  Discussed recommendations regarding Vit D and calcium supplementation (see AVS)  Follow up: Return in about 1 year (around 10/04/2020) for complete physical.   Orders Placed This Encounter  Procedures   CBC with Differential/Platelet   Comprehensive metabolic panel   Lipid panel   TSH   No orders of the defined types were placed in this encounter.     Lifestyle: Body mass index is 44.77 kg/m. Wt Readings from Last 3 Encounters:  10/05/19 277 lb 6.4 oz (125.8 kg)  10/04/18 263 lb 9.6 oz (119.6 kg)  09/08/17 265 lb 6.4 oz (120.4 kg)    Patient Active Problem List   Diagnosis Date Noted   Tinea versicolor  07/13/2018   Morbid obesity ([place]) 09/02/2013   Seborrheic dermatitis of scalp 09/02/2013   Health Maintenance  Topic Date Due   Hepatitis C Screening  Never done   INFLUENZA VACCINE  09/11/2019   TETANUS/TDAP  07/07/2022   PAP SMEAR-Modifier  10/04/2023   COVID-19 Vaccine  Completed   HIV Screening  Completed   Immunization History  Administered Date(s) Administered   PFIZER SARS-COV-2 Vaccination 04/20/2019, 05/18/2019   Tdap 07/06/2012   We updated and reviewed the patient's past history in detail and it is documented below. Allergies: Patient has No Known Allergies. Past Medical History Patient  has a past medical history of Allergy, Chicken pox, and Eczema. Past Surgical History Patient  has a past surgical history that includes Wisdom tooth extraction. Family History: Patient family history includes Cerebral palsy in her brother; Diabetes in her maternal grandmother and mother; Heart disease in her father and paternal grandfather; Hypertension in her father, maternal grandmother, mother, paternal grandfather, and paternal grandmother; Seizures in her brother; Thyroid disease in her mother. Social History:  Patient  reports that she has never smoked. She has never used smokeless tobacco. She reports current alcohol use of about 1.0 standard drink of alcohol per week. She reports that she does not use drugs.  Review of Systems: Constitutional: negative for fever or malaise Ophthalmic: negative for photophobia, double vision or loss of vision Cardiovascular: negative for chest pain, dyspnea on exertion, or new LE swelling Respiratory: negative for SOB  or persistent cough Gastrointestinal: negative for abdominal pain, change in bowel habits or melena Genitourinary: negative for dysuria or gross hematuria, no abnormal uterine bleeding or disharge Musculoskeletal: negative for new gait disturbance or muscular weakness Integumentary: negative for new or persistent  rashes, no breast lumps Neurological: negative for TIA or stroke symptoms Psychiatric: negative for SI or delusions Allergic/Immunologic: negative for hives Patient Care Team    Relationship Specialty Notifications Start End  Leamon Arnt, MD PCP - General Family Medicine  06/23/17   Loney Loh, MD  Dermatology  06/23/17     Objective  Vitals: BP 124/72    Pulse 94    Temp 97.9 F (36.6 C) (Temporal)    Resp 18    Ht 5\' 6"  (1.676 m)    Wt 277 lb 6.4 oz (125.8 kg)    SpO2 98%    BMI 44.77 kg/m  General:  Well developed, well nourished, no acute distress  Psych:  Alert and orientedx3,normal mood and affect HEENT:  Normocephalic, atraumatic, non-icteric sclera, supple neck without adenopathy, mass or thyromegaly Cardiovascular:  Normal S1, S2, RRR without gallop, rub or murmur Respiratory:  Good breath sounds bilaterally, CTAB with normal respiratory effort Gastrointestinal: normal bowel sounds, soft, non-tender, no noted masses. No HSM MSK: no deformities, contusions. Joints are without erythema or swelling. Spine and CVA region are nontender Skin:  Warm, no rashes or suspicious lesions noted Neurologic:    Mental status is normal. Gross motor and sensory exams are normal. Normal gait. No tremor Breast Exam: No mass, skin retraction or nipple discharge is appreciated in either breast. No axillary adenopathy. Fibrocystic changes are not noted    Commons side effects, risks, benefits, and alternatives for medications and treatment plan prescribed today were discussed, and the patient expressed understanding of the given instructions. Patient is instructed to call or message via MyChart if he/she has any questions or concerns regarding our treatment plan. No barriers to understanding were identified. We discussed Red Flag symptoms and signs in detail. Patient expressed understanding regarding what to do in case of urgent or emergency type symptoms.   Medication list was reconciled,  printed and provided to the patient in AVS. Patient instructions and summary information was reviewed with the patient as documented in the AVS. This note was prepared with assistance of Dragon voice recognition software. Occasional wrong-word or sound-a-like substitutions may have occurred due to the inherent limitations of voice recognition software  This visit occurred during the SARS-CoV-2 public health emergency.  Safety protocols were in place, including screening questions prior to the visit, additional usage of staff PPE, and extensive cleaning of exam room while observing appropriate contact time as indicated for disinfecting solutions.

## 2019-10-05 NOTE — Patient Instructions (Signed)
Please return in 12 months for your annual complete physical; please come fasting.  I will release your lab results to you on your MyChart account with further instructions. Please reply with any questions.   If you have any questions or concerns, please don't hesitate to send me a message via MyChart or call the office at [phone]. Thank you for visiting with Patricia Chase today! It's our pleasure caring for you.   Preventive Care 3-34 Years Old, Female Preventive care refers to visits with your health care provider and lifestyle choices that can promote health and wellness. This includes:  A yearly physical exam. This may also be called an annual well check.  Regular dental visits and eye exams.  Immunizations.  Screening for certain conditions.  Healthy lifestyle choices, such as eating a healthy diet, getting regular exercise, not using drugs or products that contain nicotine and tobacco, and limiting alcohol use. What can I expect for my preventive care visit? Physical exam Your health care provider will check your:  Height and weight. This may be used to calculate body mass index (BMI), which tells if you are at a healthy weight.  Heart rate and blood pressure.  Skin for abnormal spots. Counseling Your health care provider may ask you questions about your:  Alcohol, tobacco, and drug use.  Emotional well-being.  Home and relationship well-being.  Sexual activity.  Eating habits.  Work and work Statistician.  Method of birth control.  Menstrual cycle.  Pregnancy history. What immunizations do I need?  Influenza (flu) vaccine  This is recommended every year. Tetanus, diphtheria, and pertussis (Tdap) vaccine  You may need a Td booster every 10 years. Varicella (chickenpox) vaccine  You may need this if you have not been vaccinated. Human papillomavirus (HPV) vaccine  If recommended by your health care provider, you may need three doses over 6 months. Measles,  mumps, and rubella (MMR) vaccine  You may need at least one dose of MMR. You may also need a second dose. Meningococcal conjugate (MenACWY) vaccine  One dose is recommended if you are age 22-21 years and a first-year college student living in a residence hall, or if you have one of several medical conditions. You may also need additional booster doses. Pneumococcal conjugate (PCV13) vaccine  You may need this if you have certain conditions and were not previously vaccinated. Pneumococcal polysaccharide (PPSV23) vaccine  You may need one or two doses if you smoke cigarettes or if you have certain conditions. Hepatitis A vaccine  You may need this if you have certain conditions or if you travel or work in places where you may be exposed to hepatitis A. Hepatitis B vaccine  You may need this if you have certain conditions or if you travel or work in places where you may be exposed to hepatitis B. Haemophilus influenzae type b (Hib) vaccine  You may need this if you have certain conditions. You may receive vaccines as individual doses or as more than one vaccine together in one shot (combination vaccines). Talk with your health care provider about the risks and benefits of combination vaccines. What tests do I need?  Blood tests  Lipid and cholesterol levels. These may be checked every 5 years starting at age 53.  Hepatitis C test.  Hepatitis B test. Screening  Diabetes screening. This is done by checking your blood sugar (glucose) after you have not eaten for a while (fasting).  Sexually transmitted disease (STD) testing.  BRCA-related cancer screening. This may be  done if you have a family history of breast, ovarian, tubal, or peritoneal cancers.  Pelvic exam and Pap test. This may be done every 3 years starting at age 57. Starting at age 23, this may be done every 5 years if you have a Pap test in combination with an HPV test. Talk with your health care provider about your test  results, treatment options, and if necessary, the need for more tests. Follow these instructions at home: Eating and drinking   Eat a diet that includes fresh fruits and vegetables, whole grains, lean protein, and low-fat dairy.  Take vitamin and mineral supplements as recommended by your health care provider.  Do not drink alcohol if: ? Your health care provider tells you not to drink. ? You are pregnant, may be pregnant, or are planning to become pregnant.  If you drink alcohol: ? Limit how much you have to 0-1 drink a day. ? Be aware of how much alcohol is in your drink. In the [place] one drink equals one 12 oz bottle of beer (355 mL), one 5 oz glass of wine (148 mL), or one 1 oz glass of hard liquor (44 mL). Lifestyle  Take daily care of your teeth and gums.  Stay active. Exercise for at least 30 minutes on 5 or more days each week.  Do not use any products that contain nicotine or tobacco, such as cigarettes, e-cigarettes, and chewing tobacco. If you need help quitting, ask your health care provider.  If you are sexually active, practice safe sex. Use a condom or other form of birth control (contraception) in order to prevent pregnancy and STIs (sexually transmitted infections). If you plan to become pregnant, see your health care provider for a preconception visit. What's next?  Visit your health care provider once a year for a well check visit.  Ask your health care provider how often you should have your eyes and teeth checked.  Stay up to date on all vaccines. This information is not intended to replace advice given to you by your health care provider. Make sure you discuss any questions you have with your health care provider. Document Revised: 10/08/2017 Document Reviewed: 10/08/2017 Elsevier Patient Education  2020 [employer].

## 2020-08-29 ENCOUNTER — Telehealth: Payer: BC Managed Care – PPO | Admitting: Physician Assistant

## 2020-08-29 DIAGNOSIS — B9789 Other viral agents as the cause of diseases classified elsewhere: Secondary | ICD-10-CM | POA: Diagnosis not present

## 2020-08-29 DIAGNOSIS — J019 Acute sinusitis, unspecified: Secondary | ICD-10-CM | POA: Diagnosis not present

## 2020-08-29 MED ORDER — FLUTICASONE PROPIONATE 50 MCG/ACT NA SUSP: 2.0000 | Freq: Every day | NASAL | 0 refills | Status: DC

## 2020-08-29 NOTE — Progress Notes (Signed)
Virtual Visit Consent   Patricia Chase, you are scheduled for a virtual visit with a [facility] provider today.     Just as with appointments in the office, your consent must be obtained to participate.  Your consent will be active for this visit and any virtual visit you may have with one of our providers in the next 365 days.     If you have a MyChart account, a copy of this consent can be sent to you electronically.  All virtual visits are billed to your insurance company just like a traditional visit in the office.    As this is a virtual visit, video technology does not allow for your provider to perform a traditional examination.  This may limit your provider's ability to fully assess your condition.  If your provider identifies any concerns that need to be evaluated in person or the need to arrange testing (such as labs, EKG, etc.), we will make arrangements to do so.     Although advances in technology are sophisticated, we cannot ensure that it will always work on either your end or our end.  If the connection with a video visit is poor, the visit may have to be switched to a telephone visit.  With either a video or telephone visit, we are not always able to ensure that we have a secure connection.     I need to obtain your verbal consent now.   Are you willing to proceed with your visit today?    Azelyn Batie has provided verbal consent on 08/29/2020 for a virtual visit (video or telephone).   Leeanne Rio, [place]   Date: 08/29/2020 12:48 PM   Virtual Visit via Video Note   I, Leeanne Rio, connected with  Tonilynn Bieker  ([id number], 03/05/1985) on 08/29/20 at 12:45 PM EDT by a video-enabled telemedicine application and verified that I am speaking with the correct person using two identifiers.  Location: Patient: Virtual Visit Location Patient: Home Provider: Virtual Visit Location Provider: Home Office   I discussed the limitations of evaluation and management by  telemedicine and the availability of in person appointments. The patient expressed understanding and agreed to proceed.    History of Present Illness: Patricia Chase is a 35 y.o. who identifies as a female who was assigned female at birth, and is being seen today for URI symptoms. Endorses symptoms starting Sunday morning including sore throat, nasal congestion and headache. Notes recent travel to [place] where she was around a lot of people but endorses keeping masked the entire time. As such, she has taken 3 COVID tests so far which have all been negative. Has started Mucinex which is breaking up the congestion. Notes symptoms are improving overall but dealing with substantial issue with drainage.   HPI: HPI  Problems:  Patient Active Problem List   Diagnosis Date Noted   Tinea versicolor 07/13/2018   Morbid obesity ([place]) 09/02/2013   Seborrheic dermatitis of scalp 09/02/2013    Allergies: No Known Allergies Medications:  Current Outpatient Medications:    fluticasone (FLONASE) 50 MCG/ACT nasal spray, Place 2 sprays into both nostrils daily., Disp: 16 g, Rfl: 0   Ciclopirox 1 % shampoo, APPLY TO SCALP AND SHAMPOO. REPEAT EVERY WEEK TO EVERY OTHER WEEK, Disp: , Rfl: 10   Fluocinolone Acetonide (DERMA-SMOOTHE/FS BODY) 0.01 % OIL, Apply 1 application topically 3 (three) times a week., Disp: 1 Bottle, Rfl: 0   loratadine (CLARITIN) 10 MG tablet, Take 10 mg by  mouth daily., Disp: , Rfl:    Multiple Vitamin (MULTIVITAMIN WITH MINERALS) TABS, Take 1 tablet by mouth daily., Disp: , Rfl:   Observations/Objective: Patient is well-developed, well-nourished in no acute distress.  Resting comfortably at desk at home.  Head is normocephalic, atraumatic.  No labored breathing. Speech is clear and coherent with logical content.  Patient is alert and oriented at baseline.   Assessment and Plan: 1. Acute viral sinusitis - fluticasone (FLONASE) 50 MCG/ACT nasal spray; Place 2 sprays into both nostrils  daily.  Dispense: 16 g; Refill: 0 Negative COVID test x 3. Symptoms improving already. No alarm signs/symptoms present. Likely viral sinusitis. Supportive measures and OTC medications reviewed with patient. Rx Flonase. In-office follow-up for any non-resolving symptoms.  Follow Up Instructions: I discussed the assessment and treatment plan with the patient. The patient was provided an opportunity to ask questions and all were answered. The patient agreed with the plan and demonstrated an understanding of the instructions.  A copy of instructions were sent to the patient via MyChart.  The patient was advised to call back or seek an in-person evaluation if the symptoms worsen or if the condition fails to improve as anticipated.  Time:  I spent 10 minutes with the patient via telehealth technology discussing the above problems/concerns.    Leeanne Rio, PA-C

## 2020-08-29 NOTE — Patient Instructions (Signed)
  Patricia Chase, thank you for joining Leeanne Rio, PA-C for today's virtual visit.  While this provider is not your primary care provider (PCP), if your PCP is located in our provider database this encounter information will be shared with them immediately following your visit.  Consent: (Patient) Patricia Chase provided verbal consent for this virtual visit at the beginning of the encounter.  Current Medications:  Current Outpatient Medications:    Ciclopirox 1 % shampoo, APPLY TO SCALP AND SHAMPOO. REPEAT EVERY WEEK TO EVERY OTHER WEEK, Disp: , Rfl: 10   Fluocinolone Acetonide (DERMA-SMOOTHE/FS BODY) 0.01 % OIL, Apply 1 application topically 3 (three) times a week., Disp: 1 Bottle, Rfl: 0   loratadine (CLARITIN) 10 MG tablet, Take 10 mg by mouth daily., Disp: , Rfl:    Multiple Vitamin (MULTIVITAMIN WITH MINERALS) TABS, Take 1 tablet by mouth daily., Disp: , Rfl:    Medications ordered in this encounter:  No orders of the defined types were placed in this encounter.    *If you need refills on other medications prior to your next appointment, please contact your pharmacy*  Follow-Up: Call back or seek an in-person evaluation if the symptoms worsen or if the condition fails to improve as anticipated.  Other Instructions  Based on what you have shared with me it looks like you have sinusitis.  Sinusitis is inflammation and infection in the sinus cavities of the head.  Based on your presentation I believe you most likely have Acute Viral Sinusitis.This is an infection most likely caused by a virus. There is not specific treatment for viral sinusitis other than to help you with the symptoms until the infection runs its course.  You may use an oral decongestant such as Mucinex D or if you have glaucoma or high blood pressure use plain Mucinex. Saline nasal spray help and can safely be used as often as needed for congestion, I have prescribed: Fluticasone nasal spray two sprays in each  nostril once a day  Some authorities believe that zinc sprays or the use of Echinacea may shorten the course of your symptoms.  Sinus infections are not as easily transmitted as other respiratory infection, however we still recommend that you avoid close contact with loved ones, especially the very young and elderly.  Remember to wash your hands thoroughly throughout the day as this is the number one way to prevent the spread of infection!   If you have been instructed to have an in-person evaluation today at a local Urgent Care facility, please use the link below. It will take you to a list of all of our available [facility], including address, phone number and hours of operation. Please do not delay care.  [facility]  If you or a family member do not have a primary care provider, use the link below to schedule a visit and establish care. When you choose a [facility] primary care physician or advanced practice provider, you gain a long-term partner in health. Find a Primary Care Provider  Learn more about [facility] in-office and virtual care options: [facility]

## 2020-08-30 ENCOUNTER — Telehealth: Payer: BC Managed Care – PPO | Admitting: Family Medicine

## 2020-08-30 DIAGNOSIS — R059 Cough, unspecified: Secondary | ICD-10-CM

## 2020-08-30 DIAGNOSIS — U071 COVID-19: Secondary | ICD-10-CM | POA: Diagnosis not present

## 2020-08-30 MED ORDER — BENZONATATE 100 MG PO CAPS: 100.0000 mg | ORAL_CAPSULE | Freq: Three times a day (TID) | ORAL | 0 refills | Status: DC | PRN

## 2020-08-30 MED ORDER — NIRMATRELVIR/RITONAVIR (PAXLOVID)TABLET: 3.0000 | ORAL_TABLET | Freq: Two times a day (BID) | ORAL | 0 refills | Status: AC

## 2020-08-30 MED ORDER — PROMETHAZINE-DM 6.25-15 MG/5ML PO SYRP: 2.5000 mL | ORAL_SOLUTION | Freq: Every evening | ORAL | 0 refills | Status: DC | PRN

## 2020-08-30 NOTE — Progress Notes (Signed)
Virtual Visit via Video Note  I connected with [name] on 08/30/20 at 5:20 PM by a video enabled telemedicine application and verified that I am speaking with the correct person using two identifiers.  Patient location:home  My location: office - [place].   I discussed the limitations, risks, security and privacy concerns of performing an evaluation and management service by telephone and the availability of in person appointments. I also discussed with the patient that there may be a patient responsible charge related to this service. The patient expressed understanding and agreed to proceed, consent obtained  Chief complaint:  Chief Complaint  Patient presents with   Covid Positive    Pt erports tested positive yesterday at home test, sxs starting Monday, congested, sinus pressure and drainage, cough, headache, body aches, hot flashes      History of Present Illness: Patricia Chase is a 35 y.o. female  COVID-19 infection Initial symptoms -started 3 days ago, sinus congestion, subjective f/c few days ago now improved. Cough, with impact on sleep, body aches.  Initial test negative, then positive yesterday. More cough with lying down.  Just returned from [place] last week in [facility].  Positive test -yesterday with home testing Short of breath with coughing fit only - not at rest.  No chest pains. No confusion.   Tx: mucinex, otc cough medicine- min relief.   COVID-vaccine March and April 2021, and booster in November of last year.  COVID risk of complication score 1.    Patient Active Problem List   Diagnosis Date Noted   Tinea versicolor 07/13/2018   Morbid obesity ([place]) 09/02/2013   Seborrheic dermatitis of scalp 09/02/2013   Past Medical History:  Diagnosis Date   Allergy    Chicken pox    Eczema    On Scalp   Past Surgical History:  Procedure Laterality Date   WISDOM TOOTH EXTRACTION     2013   No Known Allergies Prior to Admission medications    Medication Sig Start Date End Date Taking? Authorizing Provider  Ciclopirox 1 % shampoo APPLY TO SCALP AND SHAMPOO. REPEAT EVERY WEEK TO EVERY OTHER WEEK 05/07/17  Yes [provider]  Fluocinolone Acetonide (DERMA-SMOOTHE/FS BODY) 0.01 % OIL Apply 1 application topically 3 (three) times a week. 09/02/13  Yes Dutch Quint B, FNP  fluticasone (FLONASE) 50 MCG/ACT nasal spray Place 2 sprays into both nostrils daily. 08/29/20  Yes Brunetta Jeans, PA-C  loratadine (CLARITIN) 10 MG tablet Take 10 mg by mouth daily.   Yes [provider]  Multiple Vitamin (MULTIVITAMIN WITH MINERALS) TABS Take 1 tablet by mouth daily.   Yes [provider]   Social History   Socioeconomic History   Marital status: Single    Spouse name: Not on file   Number of children: 0   Years of education: Not on file   Highest education level: Not on file  Occupational History   Occupation: Programmer, applications; [place] [employer]  Tobacco Use   Smoking status: Never   Smokeless tobacco: Never  Vaping Use   Vaping Use: Never used  Substance and Sexual Activity   Alcohol use: Yes    Alcohol/week: 1.0 standard drink    Types: 1 Glasses of wine per week    Comment: occassionally   Drug use: No   Sexual activity: Yes    Birth control/protection: None  Other Topics Concern   Not on file  Social History Narrative   Not on file  Social Determinants of Health   Financial Resource Strain: Not on file  Food Insecurity: Not on file  Transportation Needs: Not on file  Physical Activity: Not on file  Stress: Not on file  Social Connections: Not on file  Intimate Partner Violence: Not on file    Observations/Objective: There were no vitals filed for this visit. Nontoxic appearance on video, coherent responses, speaking in full sentences without respiratory distress.  All questions answered with understanding of plan expressed Assessment and Plan: COVID-19 virus  infection - Plan: nirmatrelvir/ritonavir EUA (PAXLOVID) TABS  Cough - Plan: benzonatate (TESSALON) 100 MG capsule, promethazine-dextromethorphan (PROMETHAZINE-DM) 6.25-15 MG/5ML syrup, nirmatrelvir/ritonavir EUA (PAXLOVID) TABS Overall mild symptoms at present  - insomnia due to cough primary concern.  Trial of Mucinex DM, and then Tessalon Perles if ineffective, then Phenergan DM if needed.  Potential side effects of meds discussed.  -Antiviral discussed, will start Paxlovid with potential side effects discussed.  ER precautions given, isolation and masking precautions given per CDC guidelines with handout by MyChart.  Follow Up Instructions: Patient Instructions  Mucinex or Mucinex DM for cough and congestion. Tessalon perles if needed for cough suppression if needed. If no relief at bedtime can use lowest effective dose of cough at night only if needed.  Tylenol for body aches and fever if needed.  Continue to drink fluids, rest.  Paxlovid 3 pills twice per day for 5 days.  If you have shortness of breath - especially at rest, you are unable to drink fluids or becoming confused/disoriented, or you are having chest pains I do recommend be seen through urgent care or emergency room.   Your employer may have specific requirements for return to work, but this information is provided from the CDC. There is a link provided below for more information if needed.   Everyone who has presumed or confirmed COVID-19 should stay home and isolate from other people for at least 5 full days (day 0 is the first day of symptoms or the date of the day of the positive viral test for asymptomatic persons). You can end isolation after 5 full days if you are fever-free for 24 hours without the use of fever-reducing medication and your other symptoms have improved (Loss of taste and smell may persist for weeks or months after recovery and need not delay the end of isolation). You should continue to wear a well-fitting  mask around others at home and in public for 5 additional days (day 6 through day 10) after the end of your 5-day isolation period. If you are unable to wear a mask when around others, you should continue to isolate for a full 10 days. Avoid people who have weakened immune systems or are more likely to get very sick from COVID-19, and nursing homes and other high-risk settings, until after at least 10 days.  If you continue to have fever or your other symptoms have not improved after 5 days of isolation, you should wait to end your isolation until you are fever-free for 24 hours without the use of fever-reducing medication and your other symptoms have improved. Continue to wear a well-fitting mask through day 10.   https://brown.org/.html  COVID-19: What to Do if You Are Sick CDC has updated isolation and quarantine recommendations for the public, and is revising the CDC website to reflect these changes. These recommendations do not apply to healthcare personnel and do not supersede state, local, tribal, or territorial laws, rules, andregulations. If you have a fever, cough or  other symptoms, you might have COVID-19. Most people have mild illness and are able to recover at home. If you are sick: Keep track of your symptoms. If you have an emergency warning sign (including trouble breathing), call 911. Steps to help prevent the spread of COVID-19 if you are sick If you are sick with COVID-19 or think you might have COVID-19, follow the steps below to care for yourself and to help protect other peoplein your home and community. Stay home except to get medical care Stay home. Most people with COVID-19 have mild illness and can recover at home without medical care. Do not leave your home, except to get medical care. Do not visit public areas. Take care of yourself. Get rest and stay hydrated. Take over-the-counter medicines, such as acetaminophen, to  help you feel better. Stay in touch with your doctor. Call before you get medical care. Be sure to get care if you have trouble breathing, or have any other emergency warning signs, or if you think it is an emergency. Avoid public transportation, ride-sharing, or taxis. Separate yourself from other people As much as possible, stay in a specific room and away from other people and pets in your home. If possible, you should use a separate bathroom. If you need to be around other people or animals in oroutside of the home, wear a mask. Tell your close contactsthat they may have been exposed to COVID-19. An infected person can spread COVID-19 starting 48 hours (or 2 days) before the person has any symptoms or tests positive. By letting your close contacts know they may have been exposed to COVID-19, you are helping to protect everyone. Additional guidance is available for those living in close quarters and shared housing. See COVID-19 and Animals if you have questions about pets. If you are diagnosed with COVID-19, someone from the health department may call you. Answer the call to slow the spread. Monitor your symptoms Symptoms of COVID-19 include fever, cough, or other symptoms. Follow care instructions from your healthcare provider and local health department. Your local health authorities may give instructions on checking your symptoms and reporting information. When to seek emergency medical attention Look for emergency warning signs* for COVID-19. If someone is showing any of these signs, seek emergency medical care immediately: Trouble breathing Persistent pain or pressure in the chest New confusion Inability to wake or stay awake Pale, gray, or blue-colored skin, lips, or nail beds, depending on skin tone *This list is not all possible symptoms. Please call your medical provider forany other symptoms that are severe or concerning to you. Call 911 or call ahead to your local emergency  facility: Notify the operator that you are seeking care for someone who has or may haveCOVID-19. Call ahead before visiting your doctor Call ahead. Many medical visits for routine care are being postponed or done by phone or telemedicine. If you have a medical appointment that cannot be postponed, call your doctor's office, and tell them you have or may have COVID-19. This will help the office protect themselves and other patients. Get tested If you have symptoms of COVID-19, get tested. While waiting for test results, you stay away from others, including staying apart from those living in your household. Self-tests are one of several options for testing for the virus that causes COVID-19 and may be more convenient than laboratory-based tests and point-of-care tests. Ask your healthcare provider or your local health department if you need help interpreting your test results. You can visit  your state, tribal, local, and territorial health department's website to look for the latest local information on testing sites. If you are sick, wear a mask over your nose and mouth You should wear a mask over your nose and mouth if you must be around other people or animals, including pets (even at home). You don't need to wear the mask if you are alone. If you can't put on a mask (because of trouble breathing, for example), cover your coughs and sneezes in some other way. Try to stay at least 6 feet away from other people. This will help protect the people around you. Masks should not be placed on young children under age 41 years, anyone who has trouble breathing, or anyone who is not able to remove the mask without help. Note: During the COVID-19 pandemic, medical grade facemasks are reserved forhealthcare workers and some first responders. Cover your coughs and sneezes Cover your mouth and nose with a tissue when you cough or sneeze. Throw away used tissues in a lined trash can. Immediately wash your hands  with soap and water for at least 20 seconds. If soap and water are not available, clean your hands with an alcohol-based hand sanitizer that contains at least 60% alcohol. Clean your hands often Wash your hands often with soap and water for at least 20 seconds. This is especially important after blowing your nose, coughing, or sneezing; going to the bathroom; and before eating or preparing food. Use hand sanitizer if soap and water are not available. Use an alcohol-based hand sanitizer with at least 60% alcohol, covering all surfaces of your hands and rubbing them together until they feel dry. Soap and water are the best option, especially if hands are visibly dirty. Avoid touching your eyes, nose, and mouth with unwashed hands. Handwashing Tips Avoid sharing personal household items Do not share dishes, drinking glasses, cups, eating utensils, towels, or bedding with other people in your home. Wash these items thoroughly after using them with soap and water or put in the dishwasher. Clean all "high-touch" surfaces every day Clean and disinfect high-touch surfaces in your "sick room" and bathroom; wear disposable gloves. Let someone else clean and disinfect surfaces in common areas, but you should clean your bedroom and bathroom, if possible. If a caregiver or other person needs to clean and disinfect a sick person's bedroom or bathroom, they should do so on an as-needed basis. The caregiver/other person should wear a mask and disposable gloves prior to cleaning. They should wait as long as possible after the person who is sick has used the bathroom before coming in to clean and use the bathroom. High-touch surfaces include phones, remote controls, counters, tabletops, doorknobs, bathroom fixtures, toilets, keyboards, tablets, and bedside tables. Clean and disinfect areas that may have blood, stool, or body fluids on them. Use household cleaners and disinfectants. Clean the area or item with soap and  water or another detergent if it is dirty. Then, use a household disinfectant. Be sure to follow the instructions on the label to ensure safe and effective use of the product. Many products recommend keeping the surface wet for several minutes to ensure germs are killed. Many also recommend precautions such as wearing gloves and making sure you have good ventilation during use of the product. Use a product from [employer] List N: Disinfectants for Coronavirus (OVANV-91). Complete Disinfection Guidance When you can be around others after being sick with COVID-19 Deciding when you can be around others is different for  different situations. Find out when you can safely end home isolation. For any additional questions about your care,contact your healthcare provider or state or local health department. 01/18/2020 Content source: [facility] for Immunization and Respiratory Diseases ([facility]), Division of Viral Diseases This information is not intended to replace advice given to you by your health care provider. Make sure you discuss any questions you have with your healthcare provider. Document Revised: 03/16/2020 Document Reviewed: 03/16/2020 Elsevier Patient Education  2022 [employer].        I discussed the assessment and treatment plan with the patient. The patient was provided an opportunity to ask questions and all were answered. The patient agreed with the plan and demonstrated an understanding of the instructions.   The patient was advised to call back or seek an in-person evaluation if the symptoms worsen or if the condition fails to improve as anticipated.  I provided 22 minutes of non-face-to-face time during this encounter.   Wendie Agreste, MD

## 2020-08-30 NOTE — Patient Instructions (Addendum)
Mucinex or Mucinex DM for cough and congestion. Tessalon perles if needed for cough suppression if needed. If no relief at bedtime can use lowest effective dose of cough at night only if needed.  Tylenol for body aches and fever if needed.  Continue to drink fluids, rest.  Paxlovid 3 pills twice per day for 5 days.  If you have shortness of breath - especially at rest, you are unable to drink fluids or becoming confused/disoriented, or you are having chest pains I do recommend be seen through urgent care or emergency room.   Your employer may have specific requirements for return to work, but this information is provided from the CDC. There is a link provided below for more information if needed.   Everyone who has presumed or confirmed COVID-19 should stay home and isolate from other people for at least 5 full days (day 0 is the first day of symptoms or the date of the day of the positive viral test for asymptomatic persons). You can end isolation after 5 full days if you are fever-free for 24 hours without the use of fever-reducing medication and your other symptoms have improved (Loss of taste and smell may persist for weeks or months after recovery and need not delay the end of isolation). You should continue to wear a well-fitting mask around others at home and in public for 5 additional days (day 6 through day 10) after the end of your 5-day isolation period. If you are unable to wear a mask when around others, you should continue to isolate for a full 10 days. Avoid people who have weakened immune systems or are more likely to get very sick from COVID-19, and nursing homes and other high-risk settings, until after at least 10 days.  If you continue to have fever or your other symptoms have not improved after 5 days of isolation, you should wait to end your isolation until you are fever-free for 24 hours without the use of fever-reducing medication and your other symptoms have improved. Continue to  wear a well-fitting mask through day 10.   https://brown.org/.html  COVID-19: What to Do if You Are Sick CDC has updated isolation and quarantine recommendations for the public, and is revising the CDC website to reflect these changes. These recommendations do not apply to healthcare personnel and do not supersede state, local, tribal, or territorial laws, rules, andregulations. If you have a fever, cough or other symptoms, you might have COVID-19. Most people have mild illness and are able to recover at home. If you are sick: Keep track of your symptoms. If you have an emergency warning sign (including trouble breathing), call 911. Steps to help prevent the spread of COVID-19 if you are sick If you are sick with COVID-19 or think you might have COVID-19, follow the steps below to care for yourself and to help protect other peoplein your home and community. Stay home except to get medical care Stay home. Most people with COVID-19 have mild illness and can recover at home without medical care. Do not leave your home, except to get medical care. Do not visit public areas. Take care of yourself. Get rest and stay hydrated. Take over-the-counter medicines, such as acetaminophen, to help you feel better. Stay in touch with your doctor. Call before you get medical care. Be sure to get care if you have trouble breathing, or have any other emergency warning signs, or if you think it is an emergency. Avoid public transportation, ride-sharing, or taxis.  Separate yourself from other people As much as possible, stay in a specific room and away from other people and pets in your home. If possible, you should use a separate bathroom. If you need to be around other people or animals in oroutside of the home, wear a mask. Tell your close contactsthat they may have been exposed to COVID-19. An infected person can spread COVID-19 starting 48 hours (or 2 days)  before the person has any symptoms or tests positive. By letting your close contacts know they may have been exposed to COVID-19, you are helping to protect everyone. Additional guidance is available for those living in close quarters and shared housing. See COVID-19 and Animals if you have questions about pets. If you are diagnosed with COVID-19, someone from the health department may call you. Answer the call to slow the spread. Monitor your symptoms Symptoms of COVID-19 include fever, cough, or other symptoms. Follow care instructions from your healthcare provider and local health department. Your local health authorities may give instructions on checking your symptoms and reporting information. When to seek emergency medical attention Look for emergency warning signs* for COVID-19. If someone is showing any of these signs, seek emergency medical care immediately: Trouble breathing Persistent pain or pressure in the chest New confusion Inability to wake or stay awake Pale, gray, or blue-colored skin, lips, or nail beds, depending on skin tone *This list is not all possible symptoms. Please call your medical provider forany other symptoms that are severe or concerning to you. Call 911 or call ahead to your local emergency facility: Notify the operator that you are seeking care for someone who has or may haveCOVID-19. Call ahead before visiting your doctor Call ahead. Many medical visits for routine care are being postponed or done by phone or telemedicine. If you have a medical appointment that cannot be postponed, call your doctor's office, and tell them you have or may have COVID-19. This will help the office protect themselves and other patients. Get tested If you have symptoms of COVID-19, get tested. While waiting for test results, you stay away from others, including staying apart from those living in your household. Self-tests are one of several options for testing for the virus that  causes COVID-19 and may be more convenient than laboratory-based tests and point-of-care tests. Ask your healthcare provider or your local health department if you need help interpreting your test results. You can visit your state, tribal, local, and territorial health department's website to look for the latest local information on testing sites. If you are sick, wear a mask over your nose and mouth You should wear a mask over your nose and mouth if you must be around other people or animals, including pets (even at home). You don't need to wear the mask if you are alone. If you can't put on a mask (because of trouble breathing, for example), cover your coughs and sneezes in some other way. Try to stay at least 6 feet away from other people. This will help protect the people around you. Masks should not be placed on young children under age 67 years, anyone who has trouble breathing, or anyone who is not able to remove the mask without help. Note: During the COVID-19 pandemic, medical grade facemasks are reserved forhealthcare workers and some first responders. Cover your coughs and sneezes Cover your mouth and nose with a tissue when you cough or sneeze. Throw away used tissues in a lined trash can. Immediately wash your hands  with soap and water for at least 20 seconds. If soap and water are not available, clean your hands with an alcohol-based hand sanitizer that contains at least 60% alcohol. Clean your hands often Wash your hands often with soap and water for at least 20 seconds. This is especially important after blowing your nose, coughing, or sneezing; going to the bathroom; and before eating or preparing food. Use hand sanitizer if soap and water are not available. Use an alcohol-based hand sanitizer with at least 60% alcohol, covering all surfaces of your hands and rubbing them together until they feel dry. Soap and water are the best option, especially if hands are visibly dirty. Avoid  touching your eyes, nose, and mouth with unwashed hands. Handwashing Tips Avoid sharing personal household items Do not share dishes, drinking glasses, cups, eating utensils, towels, or bedding with other people in your home. Wash these items thoroughly after using them with soap and water or put in the dishwasher. Clean all "high-touch" surfaces every day Clean and disinfect high-touch surfaces in your "sick room" and bathroom; wear disposable gloves. Let someone else clean and disinfect surfaces in common areas, but you should clean your bedroom and bathroom, if possible. If a caregiver or other person needs to clean and disinfect a sick person's bedroom or bathroom, they should do so on an as-needed basis. The caregiver/other person should wear a mask and disposable gloves prior to cleaning. They should wait as long as possible after the person who is sick has used the bathroom before coming in to clean and use the bathroom. High-touch surfaces include phones, remote controls, counters, tabletops, doorknobs, bathroom fixtures, toilets, keyboards, tablets, and bedside tables. Clean and disinfect areas that may have blood, stool, or body fluids on them. Use household cleaners and disinfectants. Clean the area or item with soap and water or another detergent if it is dirty. Then, use a household disinfectant. Be sure to follow the instructions on the label to ensure safe and effective use of the product. Many products recommend keeping the surface wet for several minutes to ensure germs are killed. Many also recommend precautions such as wearing gloves and making sure you have good ventilation during use of the product. Use a product from [employer] List N: Disinfectants for Coronavirus (YBWLS-93). Complete Disinfection Guidance When you can be around others after being sick with COVID-19 Deciding when you can be around others is different for different situations. Find out when you can safely end home  isolation. For any additional questions about your care,contact your healthcare provider or state or local health department. 01/18/2020 Content source: [facility] ([facility]), Division of Viral Diseases This information is not intended to replace advice given to you by your health care provider. Make sure you discuss any questions you have with your healthcare provider. Document Revised: 03/16/2020 Document Reviewed: 03/16/2020 Elsevier Patient Education  [place].

## 2020-09-25 ENCOUNTER — Other Ambulatory Visit: Payer: Self-pay

## 2020-09-25 DIAGNOSIS — B9789 Other viral agents as the cause of diseases classified elsewhere: Secondary | ICD-10-CM

## 2020-09-25 MED ORDER — FLUTICASONE PROPIONATE 50 MCG/ACT NA SUSP: 2.0000 | Freq: Every day | NASAL | 0 refills | Status: DC

## 2020-10-05 ENCOUNTER — Encounter: Payer: BC Managed Care – PPO | Admitting: Family Medicine

## 2020-10-08 ENCOUNTER — Encounter: Payer: BC Managed Care – PPO | Admitting: Family Medicine

## 2020-10-20 ENCOUNTER — Other Ambulatory Visit: Payer: Self-pay | Admitting: Family Medicine

## 2020-10-20 DIAGNOSIS — B9789 Other viral agents as the cause of diseases classified elsewhere: Secondary | ICD-10-CM

## 2020-10-20 DIAGNOSIS — J019 Acute sinusitis, unspecified: Secondary | ICD-10-CM

## 2020-10-22 ENCOUNTER — Other Ambulatory Visit: Payer: Self-pay

## 2020-10-22 ENCOUNTER — Encounter: Payer: Self-pay | Admitting: Family Medicine

## 2020-10-22 ENCOUNTER — Ambulatory Visit (INDEPENDENT_AMBULATORY_CARE_PROVIDER_SITE_OTHER): Payer: BC Managed Care – PPO | Admitting: Family Medicine

## 2020-10-22 VITALS — BP 140/96 | HR 95 | Temp 98.1°F | Ht 66.0 in | Wt 289.2 lb

## 2020-10-22 DIAGNOSIS — Z0000 Encounter for general adult medical examination without abnormal findings: Secondary | ICD-10-CM

## 2020-10-22 DIAGNOSIS — Z23 Encounter for immunization: Secondary | ICD-10-CM

## 2020-10-22 DIAGNOSIS — N920 Excessive and frequent menstruation with regular cycle: Secondary | ICD-10-CM | POA: Diagnosis not present

## 2020-10-22 DIAGNOSIS — R030 Elevated blood-pressure reading, without diagnosis of hypertension: Secondary | ICD-10-CM | POA: Diagnosis not present

## 2020-10-22 LAB — CBC WITH DIFFERENTIAL/PLATELET
Basophils Absolute: 0.1 10*3/uL (ref 0.0–0.1)
Basophils Relative: 0.7 % (ref 0.0–3.0)
Eosinophils Absolute: 0 10*3/uL (ref 0.0–0.7)
Eosinophils Relative: 0.5 % (ref 0.0–5.0)
HCT: 34.3 % — ABNORMAL LOW (ref 36.0–46.0)
Hemoglobin: 10.6 g/dL — ABNORMAL LOW (ref 12.0–15.0)
Lymphocytes Relative: 29.8 % (ref 12.0–46.0)
Lymphs Abs: 2.9 10*3/uL (ref 0.7–4.0)
MCHC: 31 g/dL (ref 30.0–36.0)
MCV: 73.3 fl — ABNORMAL LOW (ref 78.0–100.0)
Monocytes Absolute: 0.6 10*3/uL (ref 0.1–1.0)
Monocytes Relative: 6.5 % (ref 3.0–12.0)
Neutro Abs: 6.1 10*3/uL (ref 1.4–7.7)
Neutrophils Relative %: 62.5 % (ref 43.0–77.0)
Platelets: 405 10*3/uL — ABNORMAL HIGH (ref 150.0–400.0)
RBC: 4.68 Mil/uL (ref 3.87–5.11)
RDW: 18.8 % — ABNORMAL HIGH (ref 11.5–15.5)
WBC: 9.8 10*3/uL (ref 4.0–10.5)

## 2020-10-22 LAB — COMPREHENSIVE METABOLIC PANEL
ALT: 26 U/L (ref 0–35)
AST: 24 U/L (ref 0–37)
Albumin: 4.2 g/dL (ref 3.5–5.2)
Alkaline Phosphatase: 73 U/L (ref 39–117)
BUN: 10 mg/dL (ref 6–23)
CO2: 26 mEq/L (ref 19–32)
Calcium: 9.3 mg/dL (ref 8.4–10.5)
Chloride: 102 mEq/L (ref 96–112)
Creatinine, Ser: 0.78 mg/dL (ref 0.40–1.20)
GFR: 98.55 mL/min (ref >60.00)
Glucose, Bld: 114 mg/dL — ABNORMAL HIGH (ref 70–99)
Potassium: 3.8 mEq/L (ref 3.5–5.1)
Sodium: 139 mEq/L (ref 135–145)
Total Bilirubin: 0.3 mg/dL (ref 0.2–1.2)
Total Protein: 7.3 g/dL (ref 6.0–8.3)

## 2020-10-22 LAB — LIPID PANEL
Cholesterol: 139 mg/dL (ref 0–200)
HDL: 50.9 mg/dL (ref >39.00)
LDL Cholesterol: 74 mg/dL (ref 0–99)
NonHDL: 88.34
Total CHOL/HDL Ratio: 3
Triglycerides: 74 mg/dL (ref 0.0–149.0)
VLDL: 14.8 mg/dL (ref 0.0–40.0)

## 2020-10-22 LAB — TSH: TSH: 3.01 u[IU]/mL (ref 0.35–5.50)

## 2020-10-22 MED ORDER — NORTREL 1/35 (28) 1-35 MG-MCG PO TABS: 1.0000 | ORAL_TABLET | Freq: Every day | ORAL | 3 refills | Status: DC

## 2020-10-22 NOTE — Progress Notes (Signed)
Subjective  Chief Complaint  Patient presents with   Annual Exam   Contraception    HPI: Patricia Chase is a 35 y.o. female who presents to [facility] [place] today for a Female Wellness Visit.  She also has the concerns and/or needs as listed above in the chief complaint. These will be addressed in addition to the Health Maintenance Visit.   Wellness Visit: annual visit with health maintenance review and exam without Pap  HM: screens are up todate. Overall doing well. Chronic disease management visit and/or acute problem visit: Regular menses but are heavy. Some clots on days 2-3. No fatigue, cp or palpitations or pelvic pain/cramping or GYN sxs but has h/o anemia. Had been on OCPs in past to regulate. No currently in a sexual relationship.  Weight: stable but can't lose. Diet is variable. Little exercise.   Assessment  1. Annual physical exam   2. Morbid obesity ([place])   3. Menorrhagia with regular cycle   4. Elevated blood pressure reading without diagnosis of hypertension   5. Need for immunization against influenza      Plan  Female Wellness Visit: Age appropriate Health Maintenance and Prevention measures were discussed with patient. Included topics are cancer screening recommendations, ways to keep healthy (see AVS) including dietary and exercise recommendations, regular eye and dental care, use of seat belts, and avoidance of moderate alcohol use and tobacco use.  BMI: discussed patient's BMI and encouraged positive lifestyle modifications to help get to or maintain a target BMI. HM needs and immunizations were addressed and ordered. See below for orders. See HM and immunization section for updates. Routine labs and screening tests ordered including cmp, cbc and lipids where appropriate. Discussed recommendations regarding Vit D and calcium supplementation (see AVS)  Chronic disease f/u and/or acute problem visit: (deemed necessary to be done in addition  to the wellness visit): menorrhagia:  check labs. Restart ocps. If remains heavy, recommend consideration of IUD. Weight mgt: nutrition counseling done.  Elevated bp: start home checks and recheck in 3 months. + FH. Low salt diet.  Follow up: Return in about 3 months (around 01/21/2021) for recheck blood pressure.   Orders Placed This Encounter  Procedures   Flu Vaccine QUAD 46moIM (Fluarix, Fluzone & Alfiuria Quad PF)   CBC with Differential/Platelet   Comprehensive metabolic panel   Lipid panel   HIV Antibody (routine testing w rflx)   Hepatitis C antibody   Iron, TIBC and Ferritin Panel   TSH   Meds ordered this encounter  Medications   NORTREL 1/35, 28, tablet    Sig: Take 1 tablet by mouth daily.    Dispense:  90 tablet    Refill:  3       Body mass index is 46.68 kg/m. Wt Readings from Last 3 Encounters:  10/22/20 289 lb 3.2 oz (131.2 kg)  10/05/19 277 lb 6.4 oz (125.8 kg)  10/04/18 263 lb 9.6 oz (119.6 kg)   Need for contraception: No,   Patient Active Problem List   Diagnosis Date Noted   Tinea versicolor 07/13/2018   Morbid obesity ([place] 09/02/2013   Seborrheic dermatitis of scalp 09/02/2013   Health Maintenance  Topic Date Due   Hepatitis C Screening  Never done   TETANUS/TDAP  07/07/2022   PAP SMEAR-Modifier  10/04/2023   INFLUENZA VACCINE  Completed   COVID-19 Vaccine  Completed   HIV Screening  Completed   Pneumococcal Vaccine [id number] old  Aged Out  HPV VACCINES  Aged Out   Immunization History  Administered Date(s) Administered   Influenza,inj,Quad PF,6+ Mos 10/22/2020   PFIZER(Purple Top)SARS-COV-2 Vaccination 04/20/2019, 05/18/2019, 12/14/2019   Tdap 07/06/2012   We updated and reviewed the patient's past history in detail and it is documented below. Allergies: Patient  reports current alcohol use of about 1.0 standard drink per week. Past Medical History Patient  has a past medical history of Allergy, Chicken pox, and Eczema. Past  Surgical History Patient  has a past surgical history that includes Wisdom tooth extraction. Social History   Socioeconomic History   Marital status: Single    Spouse name: Not on file   Number of children: 0   Years of education: Not on file   Highest education level: Not on file  Occupational History   Occupation: Programmer, applications; [place] [employer]  Tobacco Use   Smoking status: Never   Smokeless tobacco: Never  Vaping Use   Vaping Use: Never used  Substance and Sexual Activity   Alcohol use: Yes    Alcohol/week: 1.0 standard drink    Types: 1 Glasses of wine per week    Comment: occassionally   Drug use: No   Sexual activity: Yes    Birth control/protection: None  Other Topics Concern   Not on file  Social History Narrative   Not on file   Social Determinants of Health   Financial Resource Strain: Not on file  Food Insecurity: Not on file  Transportation Needs: Not on file  Physical Activity: Not on file  Stress: Not on file  Social Connections: Not on file   Family History  Problem Relation Age of Onset   Hypertension Mother    Diabetes Mother    Thyroid disease Mother    Heart disease Father    Hypertension Father    Hypertension Maternal Grandmother    Diabetes Maternal Grandmother    Hypertension Paternal Grandmother    Heart disease Paternal Grandfather    Hypertension Paternal Grandfather    Cerebral palsy Brother    Seizures Brother     Review of Systems: Constitutional: negative for fever or malaise Ophthalmic: negative for photophobia, double vision or loss of vision Cardiovascular: negative for chest pain, dyspnea on exertion, or new LE swelling Respiratory: negative for SOB or persistent cough Gastrointestinal: negative for abdominal pain, change in bowel habits or melena Genitourinary: negative for dysuria or gross hematuria, no abnormal uterine bleeding or disharge Musculoskeletal: negative for new gait  disturbance or muscular weakness Integumentary: negative for new or persistent rashes, no breast lumps Neurological: negative for TIA or stroke symptoms Psychiatric: negative for SI or delusions Allergic/Immunologic: negative for hives  Patient Care Team    Relationship Specialty Notifications Start End  Leamon Arnt, MD PCP - General Family Medicine  06/23/17   Loney Loh, MD  Dermatology  06/23/17     Objective  Vitals: BP (!) 140/96   Pulse 95   Temp 98.1 F (36.7 C) (Temporal)   Ht '5\' 6"'$  (1.676 m)   Wt 289 lb 3.2 oz (131.2 kg)   LMP 10/13/2020 (Exact Date)   SpO2 98%   BMI 46.68 kg/m  General:  Well developed, well nourished, no acute distress  Psych:  Alert and orientedx3,normal mood and affect HEENT:  Normocephalic, atraumatic, non-icteric sclera, PERRL, supple neck without adenopathy, mass or thyromegaly Cardiovascular:  Normal S1, S2, RRR without gallop, rub or murmur Respiratory:  Good breath sounds bilaterally, CTAB with  normal respiratory effort Gastrointestinal: normal bowel sounds, soft, non-tender, no noted masses. No HSM MSK: no deformities, contusions. Joints are without erythema or swelling.  Skin:  Warm, no rashes or suspicious lesions noted Neurologic:    Mental status is normal. Gross motor and sensory exams are normal. Normal gait. No tremor    Commons side effects, risks, benefits, and alternatives for medications and treatment plan prescribed today were discussed, and the patient expressed understanding of the given instructions. Patient is instructed to call or message via MyChart if he/she has any questions or concerns regarding our treatment plan. No barriers to understanding were identified. We discussed Red Flag symptoms and signs in detail. Patient expressed understanding regarding what to do in case of urgent or emergency type symptoms.  Medication list was reconciled, printed and provided to the patient in AVS. Patient instructions and summary  information was reviewed with the patient as documented in the AVS. This note was prepared with assistance of Dragon voice recognition software. Occasional wrong-word or sound-a-like substitutions may have occurred due to the inherent limitations of voice recognition software  This visit occurred during the SARS-CoV-2 public health emergency.  Safety protocols were in place, including screening questions prior to the visit, additional usage of staff PPE, and extensive cleaning of exam room while observing appropriate contact time as indicated for disinfecting solutions.

## 2020-10-22 NOTE — Patient Instructions (Addendum)
Please return in 3 months for blood pressure recheck.   Please start checking your blood pressures at home and logging the readings. If all readings are < 140/85, then let me know and you can cancel your follow up appointment. Otherwise, bring in your readings and we will reassess in 3 months. Eat a low salt diet.  Add raw fruits and veggies to diet for healthy and convenience as we discussed.   I will release your lab results to you on your MyChart account with further instructions. Please reply with any questions.    Starting Your Oral Contraception: this is to help regulate and lighten your cycles.  1) First Day Start - Take your first pill during the first 24 hours of your menstrual cycle. No back-up contraceptivemethod is needed when the pill is started the first day of your menses.  Recommended option: 2) Sunday Start - Wait until the first Sunday after your menstrual cycle begins to take your first pill. With this optionuse another method of birth control for the first 14 days of the first cycle only.  3) "Quick Start" - Start the pill today. If you have had unprotected sexual intercourse since your last period, perform a pregnancy test prior to starting the pill. If it is negative, start the pill today. Use another method of birth control such as condoms or spermicide the first seven days of the first cycle of use.   Oral Contraception Answers to Common Questions.   1)  Take your birth control pill at the same time every day. This ensures that a constant hormone level is maintained at all times.  2) Should you experience nausea or vomiting, try taking your pill after a meal or at bedtime.  3)  Irregular vaginal bleeding or spotting is common while starting the pill, especially during the first few months of oral contraceptive use. It should resolve by 6 months.   4) Birth control pills do not protect you from sexually transmitted infections.   INSTRUCTIONS FOR MISSED PILLS: 1  active pill < 24 hours late in any week: Take 1 active pill ASAP* and continue pack as usual.  Missed 1 active pills (i.e., >24 hours late):  Take 1 active pill ASAP* and continue pack as usual, (take 2nd pill that day) Back-up contraception for 7 days. Consider Emergency Contraception (EC) if unprotected intercourse occurred within the  5 days prior to missing pill.  If missed more than 2 pills, throw away pack and start new pack on Sunday; Take a pregnancy test prior to starting new pack.    MEDICATIONS AND BIRTH CONTROL PILLS: The following medications and supplements may interfere with the effectiveness of CHC:  some antibiotics  anticonvulsants  St. John's Wort  Provigil  It is recommend that if you take the above medications and supplements and are sexually active with a female partner, you use a back-up method of birth control while using the medication and for 7 consecutive days once the medication is completed.   IF YOU EXPERIENCE ANY OF THE FOLLOWING, PLEASE CALL IMMEDIATELY OR GO TO THE EMERGENCY ROOM:  severe abdominal pain or tenderness in the lower abdomen chest pain, sharp, sudden shortness of breath or coughing up blood headache, severe and sudden, or vomiting, dizziness or faintness eyesight problems, such as sudden blurred or doubled vision or flashes of light severe pain or swelling in calf or groin   If you have any questions or concerns, please don't hesitate to send me a message  via MyChart or call the office at [phone]. Thank you for visiting with [place] today! It's our pleasure caring for you.

## 2020-10-23 LAB — IRON,TIBC AND FERRITIN PANEL
%SAT: 8 % (calc) — ABNORMAL LOW (ref 16–45)
Ferritin: 5 ng/mL — ABNORMAL LOW (ref 16–154)
Iron: 32 ug/dL — ABNORMAL LOW (ref 40–190)
TIBC: 402 mcg/dL (calc) (ref 250–450)

## 2020-10-23 LAB — HIV ANTIBODY (ROUTINE TESTING W REFLEX): HIV 1&2 Ab, 4th Generation: NONREACTIVE

## 2020-10-23 LAB — HEPATITIS C ANTIBODY
Hepatitis C Ab: NONREACTIVE
SIGNAL TO CUT-OFF: 0.01 (ref <1.00)

## 2020-10-24 ENCOUNTER — Other Ambulatory Visit: Payer: Self-pay

## 2020-10-24 DIAGNOSIS — D509 Iron deficiency anemia, unspecified: Secondary | ICD-10-CM

## 2020-10-24 MED ORDER — IRON (FERROUS SULFATE) 325 (65 FE) MG PO TABS: 325.0000 mg | ORAL_TABLET | Freq: Two times a day (BID) | ORAL | 3 refills | Status: DC

## 2020-11-20 ENCOUNTER — Encounter: Payer: BC Managed Care – PPO | Admitting: Family Medicine

## 2021-01-14 ENCOUNTER — Other Ambulatory Visit: Payer: Self-pay | Admitting: Family Medicine

## 2021-01-14 DIAGNOSIS — D509 Iron deficiency anemia, unspecified: Secondary | ICD-10-CM

## 2021-01-25 ENCOUNTER — Ambulatory Visit: Payer: BC Managed Care – PPO | Admitting: Family Medicine

## 2021-01-25 ENCOUNTER — Other Ambulatory Visit: Payer: Self-pay

## 2021-01-25 ENCOUNTER — Encounter: Payer: Self-pay | Admitting: Family Medicine

## 2021-01-25 VITALS — BP 148/98 | HR 108 | Temp 97.6°F | Ht 66.0 in | Wt 287.4 lb

## 2021-01-25 DIAGNOSIS — I10 Essential (primary) hypertension: Secondary | ICD-10-CM

## 2021-01-25 DIAGNOSIS — D500 Iron deficiency anemia secondary to blood loss (chronic): Secondary | ICD-10-CM

## 2021-01-25 DIAGNOSIS — N920 Excessive and frequent menstruation with regular cycle: Secondary | ICD-10-CM | POA: Diagnosis not present

## 2021-01-25 DIAGNOSIS — Z3041 Encounter for surveillance of contraceptive pills: Secondary | ICD-10-CM | POA: Diagnosis not present

## 2021-01-25 DIAGNOSIS — I152 Hypertension secondary to endocrine disorders: Secondary | ICD-10-CM | POA: Insufficient documentation

## 2021-01-25 DIAGNOSIS — E1159 Type 2 diabetes mellitus with other circulatory complications: Secondary | ICD-10-CM | POA: Insufficient documentation

## 2021-01-25 LAB — CBC WITH DIFFERENTIAL/PLATELET
Basophils Absolute: 0.1 10*3/uL (ref 0.0–0.1)
Basophils Relative: 0.6 % (ref 0.0–3.0)
Eosinophils Absolute: 0.1 10*3/uL (ref 0.0–0.7)
Eosinophils Relative: 0.5 % (ref 0.0–5.0)
HCT: 39.5 % (ref 36.0–46.0)
Hemoglobin: 13.1 g/dL (ref 12.0–15.0)
Lymphocytes Relative: 23.3 % (ref 12.0–46.0)
Lymphs Abs: 2.2 10*3/uL (ref 0.7–4.0)
MCHC: 33.1 g/dL (ref 30.0–36.0)
MCV: 81.1 fl (ref 78.0–100.0)
Monocytes Absolute: 0.5 10*3/uL (ref 0.1–1.0)
Monocytes Relative: 5.2 % (ref 3.0–12.0)
Neutro Abs: 6.8 10*3/uL (ref 1.4–7.7)
Neutrophils Relative %: 70.4 % (ref 43.0–77.0)
Platelets: 297 10*3/uL (ref 150.0–400.0)
RBC: 4.88 Mil/uL (ref 3.87–5.11)
RDW: 17.5 % — ABNORMAL HIGH (ref 11.5–15.5)
WBC: 9.7 10*3/uL (ref 4.0–10.5)

## 2021-01-25 MED ORDER — AMLODIPINE BESYLATE 10 MG PO TABS: 10.0000 mg | ORAL_TABLET | Freq: Every day | ORAL | 3 refills | Status: DC

## 2021-01-25 MED ORDER — HYDROCHLOROTHIAZIDE 12.5 MG PO CAPS: 12.5000 mg | ORAL_CAPSULE | Freq: Every day | ORAL | 3 refills | Status: DC

## 2021-01-25 NOTE — Patient Instructions (Addendum)
Please return in 3 months to recheck blood pressure.  Start taking both blood pressure pills together every morning. Let me know if you have any problems.   We will call you with an appointment to see Dr. Terri Piedra, [place]. She is wonderful.   I will release your lab results to you on your MyChart account with further instructions. Please reply with any questions.    If you have any questions or concerns, please don't hesitate to send me a message via MyChart or call the office at [phone]. Thank you for visiting with Patricia Chase today! It's our pleasure caring for you.   Hypertension, Adult High blood pressure (hypertension) is when the force of blood pumping through the arteries is too strong. The arteries are the blood vessels that carry blood from the heart throughout the body. Hypertension forces the heart to work harder to pump blood and may cause arteries to become narrow or stiff. Untreated or uncontrolled hypertension can cause a heart attack, heart failure, a stroke, kidney disease, and other problems. A blood pressure reading consists of a higher number over a lower number. Ideally, your blood pressure should be below 120/80. The first ("top") number is called the systolic pressure. It is a measure of the pressure in your arteries as your heart beats. The second ("bottom") number is called the diastolic pressure. It is a measure of the pressure in your arteries as the heart relaxes. What are the causes? The exact cause of this condition is not known. There are some conditions that result in or are related to high blood pressure. What increases the risk? Some risk factors for high blood pressure are under your control. The following factors may make you more likely to develop this condition: Smoking. Having type 2 diabetes mellitus, high cholesterol, or both. Not getting enough exercise or physical activity. Being overweight. Having too much fat, sugar, calories, or salt (sodium) in your  diet. Drinking too much alcohol. Some risk factors for high blood pressure may be difficult or impossible to change. Some of these factors include: Having chronic kidney disease. Having a family history of high blood pressure. Age. Risk increases with age. Race. You may be at higher risk if you are African American. Gender. Men are at higher risk than women before age 31. After age 49, women are at higher risk than men. Having obstructive sleep apnea. Stress. What are the signs or symptoms? High blood pressure may not cause symptoms. Very high blood pressure (hypertensive crisis) may cause: Headache. Anxiety. Shortness of breath. Nosebleed. Nausea and vomiting. Vision changes. Severe chest pain. Seizures. How is this diagnosed? This condition is diagnosed by measuring your blood pressure while you are seated, with your arm resting on a flat surface, your legs uncrossed, and your feet flat on the floor. The cuff of the blood pressure monitor will be placed directly against the skin of your upper arm at the level of your heart. It should be measured at least twice using the same arm. Certain conditions can cause a difference in blood pressure between your right and left arms. Certain factors can cause blood pressure readings to be lower or higher than normal for a short period of time: When your blood pressure is higher when you are in a health care provider's office than when you are at home, this is called white coat hypertension. Most people with this condition do not need medicines. When your blood pressure is higher at home than when you are in a  health care provider's office, this is called masked hypertension. Most people with this condition may need medicines to control blood pressure. If you have a high blood pressure reading during one visit or you have normal blood pressure with other risk factors, you may be asked to: Return on a different day to have your blood pressure checked  again. Monitor your blood pressure at home for 1 week or longer. If you are diagnosed with hypertension, you may have other blood or imaging tests to help your health care provider understand your overall risk for other conditions. How is this treated? This condition is treated by making healthy lifestyle changes, such as eating healthy foods, exercising more, and reducing your alcohol intake. Your health care provider may prescribe medicine if lifestyle changes are not enough to get your blood pressure under control, and if: Your systolic blood pressure is above 130. Your diastolic blood pressure is above 80. Your personal target blood pressure may vary depending on your medical conditions, your age, and other factors. Follow these instructions at home: Eating and drinking  Eat a diet that is high in fiber and potassium, and low in sodium, added sugar, and fat. An example eating plan is called the DASH (Dietary Approaches to Stop Hypertension) diet. To eat this way: Eat plenty of fresh fruits and vegetables. Try to fill one half of your plate at each meal with fruits and vegetables. Eat whole grains, such as whole-wheat pasta, brown rice, or whole-grain bread. Fill about one fourth of your plate with whole grains. Eat or drink low-fat dairy products, such as skim milk or low-fat yogurt. Avoid fatty cuts of meat, processed or cured meats, and poultry with skin. Fill about one fourth of your plate with lean proteins, such as fish, chicken without skin, beans, eggs, or tofu. Avoid pre-made and processed foods. These tend to be higher in sodium, added sugar, and fat. Reduce your daily sodium intake. Most people with hypertension should eat less than 1,500 mg of sodium a day. Do not drink alcohol if: Your health care provider tells you not to drink. You are pregnant, may be pregnant, or are planning to become pregnant. If you drink alcohol: Limit how much you use to: 0-1 drink a day for  women. 0-2 drinks a day for men. Be aware of how much alcohol is in your drink. In the [place] one drink equals one 12 oz bottle of beer (355 mL), one 5 oz glass of wine (148 mL), or one 1 oz glass of hard liquor (44 mL). Lifestyle  Work with your health care provider to maintain a healthy body weight or to lose weight. Ask what an ideal weight is for you. Get at least 30 minutes of exercise most days of the week. Activities may include walking, swimming, or biking. Include exercise to strengthen your muscles (resistance exercise), such as Pilates or lifting weights, as part of your weekly exercise routine. Try to do these types of exercises for 30 minutes at least 3 days a week. Do not use any products that contain nicotine or tobacco, such as cigarettes, e-cigarettes, and chewing tobacco. If you need help quitting, ask your health care provider. Monitor your blood pressure at home as told by your health care provider. Keep all follow-up visits as told by your health care provider. This is important. Medicines Take over-the-counter and prescription medicines only as told by your health care provider. Follow directions carefully. Blood pressure medicines must be taken as prescribed. Do not  skip doses of blood pressure medicine. Doing this puts you at risk for problems and can make the medicine less effective. Ask your health care provider about side effects or reactions to medicines that you should watch for. Contact a health care provider if you: Think you are having a reaction to a medicine you are taking. Have headaches that keep coming back (recurring). Feel dizzy. Have swelling in your ankles. Have trouble with your vision. Get help right away if you: Develop a severe headache or confusion. Have unusual weakness or numbness. Feel faint. Have severe pain in your chest or abdomen. Vomit repeatedly. Have trouble breathing. Summary Hypertension is when the force of blood pumping through  your arteries is too strong. If this condition is not controlled, it may put you at risk for serious complications. Your personal target blood pressure may vary depending on your medical conditions, your age, and other factors. For most people, a normal blood pressure is less than 120/80. Hypertension is treated with lifestyle changes, medicines, or a combination of both. Lifestyle changes include losing weight, eating a healthy, low-sodium diet, exercising more, and limiting alcohol. This information is not intended to replace advice given to you by your health care provider. Make sure you discuss any questions you have with your health care provider. Document Revised: 10/07/2017 Document Reviewed: 10/07/2017 Elsevier Patient Education  [place].

## 2021-01-25 NOTE — Progress Notes (Signed)
Subjective  CC:  Chief Complaint  Patient presents with   Hypertension    HPI: Patricia Chase is a 35 y.o. female who presents to the office today to address the problems listed above in the chief complaint. Follow blood pressure: 3 months follow-up after having elevated reading in the office.  At that time she thought elevated blood pressure was circumstantial.  However, blood pressures at home initially remain high.  She was busy stressful lifestyle.  She is the Surveyor, quantity for [employer].  Physically, feeling well.  No chest pain, shortness of breath or lower extremity edema.  No palpitations.  Positive family history for hypertension.  Weight is stable, she continues to struggle with weight loss. Menorrhagia and iron deficiency anemia follow-up: Taking ferrous sulfate 325 mg twice daily.  Tolerating it well.  No constipation no adverse effects.  Started birth control.  Menstrual cycles remain regular but have not decreased in flow since starting birth control.  No dysmenorrhea.  Not currently sexually active.  No vaginal discharge.  She would like children at some point in the future but not currently active or planning.  Assessment  1. Essential hypertension   2. Iron deficiency anemia due to chronic blood loss   3. Menorrhagia with regular cycle   4. Oral contraceptive use      Plan   Hypertension: New diagnosis.  Education given.  See after visit summary for further detail.  Start amlodipine 10 mg along with Microzide 12.5 mg daily.  Recent renal function was normal.  Return in 3 months for recheck and follow-up BMP. Menorrhagia with iron deficiency anemia on oral contraceptives: We will recheck CBC and iron levels today.  Will refer to GYN.  Likely needs ultrasound and can consider different ways to manage her menorrhagia.  We did discuss Mirena IUD which she is interested in.  Education regarding management of these chronic disease states was given. Management  strategies discussed on successive visits include dietary and exercise recommendations, goals of achieving and maintaining IBW, and lifestyle modifications aiming for adequate sleep and minimizing stressors.   Follow up: Return in about 3 months (around 04/25/2021) for follow up Hypertension.  Orders Placed This Encounter  Procedures   Iron, TIBC and Ferritin Panel   CBC with Differential/Platelet   Ambulatory referral to Obstetrics / Gynecology   Meds ordered this encounter  Medications   amLODipine (NORVASC) 10 MG tablet    Sig: Take 1 tablet (10 mg total) by mouth daily.    Dispense:  90 tablet    Refill:  3   hydrochlorothiazide (MICROZIDE) 12.5 MG capsule    Sig: Take 1 capsule (12.5 mg total) by mouth daily.    Dispense:  90 capsule    Refill:  3      BP Readings from Last 3 Encounters:  01/25/21 (!) 148/98  10/22/20 (!) 140/96  10/05/19 124/72   Wt Readings from Last 3 Encounters:  01/25/21 287 lb 6.4 oz (130.4 kg)  10/22/20 289 lb 3.2 oz (131.2 kg)  10/05/19 277 lb 6.4 oz (125.8 kg)    Lab Results  Component Value Date   CHOL 139 10/22/2020   CHOL 126 10/05/2019   CHOL 113 10/04/2018   Lab Results  Component Value Date   HDL 50.90 10/22/2020   HDL 55 10/05/2019   HDL 41.80 10/04/2018   Lab Results  Component Value Date   LDLCALC 74 10/22/2020   LDLCALC 57 10/05/2019   LDLCALC 53 10/04/2018  Lab Results  Component Value Date   TRIG 74.0 10/22/2020   TRIG 55 10/05/2019   TRIG 88.0 10/04/2018   Lab Results  Component Value Date   CHOLHDL 3 10/22/2020   CHOLHDL 2.3 10/05/2019   CHOLHDL 3 10/04/2018   No results found for: LDLDIRECT Lab Results  Component Value Date   CREATININE 0.78 10/22/2020   BUN 10 10/22/2020   NA 139 10/22/2020   K 3.8 10/22/2020   CL 102 10/22/2020   CO2 26 10/22/2020    The ASCVD Risk score (Arnett DK, et al., 2019) failed to calculate for the following reasons:   The 2019 ASCVD risk score is only valid for ages  83 to 50  I reviewed the patients updated PMH, FH, and SocHx.    Patient Active Problem List   Diagnosis Date Noted   Essential hypertension 01/25/2021   Iron deficiency anemia due to chronic blood loss 01/25/2021   Menorrhagia with regular cycle 01/25/2021   Oral contraceptive use 01/25/2021   Tinea versicolor 07/13/2018   Morbid obesity ([place]) 09/02/2013   Seborrheic dermatitis of scalp 09/02/2013    Allergies: Patient has no known allergies.  Social History: Patient  reports that she has never smoked. She has never used smokeless tobacco. She reports current alcohol use of about 1.0 standard drink per week. She reports that she does not use drugs.  Current Meds  Medication Sig   amLODipine (NORVASC) 10 MG tablet Take 1 tablet (10 mg total) by mouth daily.   Ciclopirox 1 % shampoo APPLY TO SCALP AND SHAMPOO. REPEAT EVERY WEEK TO EVERY OTHER WEEK   [employer] IRON 325 (65 Fe) MG tablet TAKE 325 MG BY MOUTH 2 (TWO) TIMES DAILY.   Fluocinolone Acetonide (DERMA-SMOOTHE/FS BODY) 0.01 % OIL Apply 1 application topically 3 (three) times a week.   fluticasone (FLONASE) 50 MCG/ACT nasal spray SPRAY 2 SPRAYS INTO EACH NOSTRIL EVERY DAY   hydrochlorothiazide (MICROZIDE) 12.5 MG capsule Take 1 capsule (12.5 mg total) by mouth daily.   loratadine (CLARITIN) 10 MG tablet Take 10 mg by mouth daily.   Multiple Vitamin (MULTIVITAMIN WITH MINERALS) TABS Take 1 tablet by mouth daily.   NORTREL 1/35, 28, tablet Take 1 tablet by mouth daily.    Review of Systems: Cardiovascular: negative for chest pain, palpitations, leg swelling, orthopnea Respiratory: negative for SOB, wheezing or persistent cough Gastrointestinal: negative for abdominal pain Genitourinary: negative for dysuria or gross hematuria  Objective  Vitals: BP (!) 148/98    Pulse (!) 108    Temp 97.6 F (36.4 C) (Temporal)    Ht 5\' 6"  (1.676 m)    Wt 287 lb 6.4 oz (130.4 kg)    LMP 01/15/2021 (Approximate)    SpO2 98%    BMI 46.39 kg/m   General: no acute distress  Psych:  Alert and oriented, normal mood and affect Cardiovascular:  RRR without murmur. no edema Respiratory:  Good breath sounds bilaterally, CTAB with normal respiratory effort  Commons side effects, risks, benefits, and alternatives for medications and treatment plan prescribed today were discussed, and the patient expressed understanding of the given instructions. Patient is instructed to call or message via MyChart if he/she has any questions or concerns regarding our treatment plan. No barriers to understanding were identified. We discussed Red Flag symptoms and signs in detail. Patient expressed understanding regarding what to do in case of urgent or emergency type symptoms.  Medication list was reconciled, printed and provided to the patient in AVS. Patient  instructions and summary information was reviewed with the patient as documented in the AVS. This note was prepared with assistance of Dragon voice recognition software. Occasional wrong-word or sound-a-like substitutions may have occurred due to the inherent limitations of voice recognition software  This visit occurred during the SARS-CoV-2 public health emergency.  Safety protocols were in place, including screening questions prior to the visit, additional usage of staff PPE, and extensive cleaning of exam room while observing appropriate contact time as indicated for disinfecting solutions.

## 2021-01-26 LAB — IRON,TIBC AND FERRITIN PANEL
%SAT: 35 % (calc) (ref 16–45)
Ferritin: 31 ng/mL (ref 16–154)
Iron: 124 ug/dL (ref 40–190)
TIBC: 351 mcg/dL (calc) (ref 250–450)

## 2021-03-18 ENCOUNTER — Encounter: Payer: Self-pay | Admitting: Family Medicine

## 2021-03-18 ENCOUNTER — Other Ambulatory Visit: Payer: Self-pay

## 2021-03-18 ENCOUNTER — Ambulatory Visit: Payer: BC Managed Care – PPO | Admitting: Family Medicine

## 2021-03-18 VITALS — BP 140/89 | Temp 98.0°F | Ht 65.0 in | Wt 286.8 lb

## 2021-03-18 DIAGNOSIS — E119 Type 2 diabetes mellitus without complications: Secondary | ICD-10-CM | POA: Diagnosis not present

## 2021-03-18 DIAGNOSIS — N920 Excessive and frequent menstruation with regular cycle: Secondary | ICD-10-CM

## 2021-03-18 DIAGNOSIS — I10 Essential (primary) hypertension: Secondary | ICD-10-CM | POA: Diagnosis not present

## 2021-03-18 LAB — POCT GLYCOSYLATED HEMOGLOBIN (HGB A1C): Hemoglobin A1C: 6.6 % — AB (ref 4.0–5.6)

## 2021-03-18 NOTE — Progress Notes (Signed)
Subjective  CC:  Chief Complaint  Patient presents with   Hypertension   Menstrual Problem    Pt states very heavy, saw OBGYN. They believe she may be diabetic.    HPI: Patricia Chase is a 36 y.o. female who presents to the office today for follow up of diabetes and problems listed above in the chief complaint.  36 year old female here in September for complete physical and menorrhagia.  Has recently been evaluated by GYN for menorrhagia.  A1c at that time, January 31 was 7.3.  Lab work in September showed a nonfasting glucose of 114.  Both years prior fasting sugars are just above normal at 103 and 104.  She denies symptoms of hyperglycemia.  She reports her diet could be improved.  Does not eat an overwhelming amount of sweets.  Family history of diabetes in mom and grandmother. New onset diabetes: Discussed dietary changes that are recommended.  Decrease sweets, no sweetened beverages, recommend weight loss.  We will recheck in 3 months.  Consider metformin or other medications that time if not getting improved Hypertension: Patient reports her blood pressure was controlled at her recent doctor's visit.  GYN visit documents 131/80.  She believes her elevated blood pressure today is due to her stress level about her new diagnosis.  She is taking her blood pressure medications and tolerating them well.She takes amlodipine 10 mg daily and HCTZ 12.5 mg daily. Menorrhagia: Is scheduled for ultrasound for further clarification.  Reviewed lab work.  Wt Readings from Last 3 Encounters:  03/18/21 286 lb 12.8 oz (130.1 kg)  01/25/21 287 lb 6.4 oz (130.4 kg)  10/22/20 289 lb 3.2 oz (131.2 kg)    BP Readings from Last 3 Encounters:  03/18/21 140/89  01/25/21 (!) 148/98  10/22/20 (!) 140/96    Assessment  1. New onset type 2 diabetes mellitus ([place])   2. Morbid obesity ([place])   3. Essential hypertension   4. Menorrhagia with regular cycle      Plan  Diabetes is currently diet controlled.  Discussed improving diet to resolve. See avs. Will start walking program for exercise. Limit sweets/carbs and stop all sweetened beverages. Work on weight loss. No new meds today. Recheck 3 mo. Consider nutrtion counseling if needed. If persists, will discuss imms, eye exam, possible statin. HTN: elevated due to anxiety around new diabetes dx today. Continue meds and recheck 3 mo.  F/u with gyn for pelvic ultrasound.   Follow up: 34mo to recheck bp and diabetes. No orders of the defined types were placed in this encounter.  No orders of the defined types were placed in this encounter.     Immunization History  Administered Date(s) Administered   Influenza,inj,Quad PF,6+ Mos 10/22/2020   PFIZER(Purple Top)SARS-COV-2 Vaccination 04/20/2019, 05/18/2019, 12/14/2019   Tdap 07/06/2012    Diabetes Related Lab Review: No results found for: HGBA1C  No results found for: [place]l [place] Lab Results  Component Value Date   CREATININE 0.78 10/22/2020   BUN 10 10/22/2020   NA 139 10/22/2020   K 3.8 10/22/2020   CL 102 10/22/2020   CO2 26 10/22/2020   Lab Results  Component Value Date   CHOL 139 10/22/2020   CHOL 126 10/05/2019   CHOL 113 10/04/2018   Lab Results  Component Value Date   HDL 50.90 10/22/2020   HDL 55 10/05/2019   HDL 41.80 10/04/2018   Lab Results  Component Value Date   LDLCALC 74 10/22/2020   LDLCALC 57 10/05/2019  LDLCALC 53 10/04/2018   Lab Results  Component Value Date   TRIG 74.0 10/22/2020   TRIG 55 10/05/2019   TRIG 88.0 10/04/2018   Lab Results  Component Value Date   CHOLHDL 3 10/22/2020   CHOLHDL 2.3 10/05/2019   CHOLHDL 3 10/04/2018   No results found for: LDLDIRECT The ASCVD Risk score (Arnett DK, et al., 2019) failed to calculate for the following reasons:   The 2019 ASCVD risk score is only valid for ages 12 to 28 I have reviewed the [place], Fam and Soc history. Patient Active Problem List   Diagnosis Date Noted   Essential  hypertension 01/25/2021   Iron deficiency anemia due to chronic blood loss 01/25/2021   Menorrhagia with regular cycle 01/25/2021   Oral contraceptive use 01/25/2021   Tinea versicolor 07/13/2018   Morbid obesity ([place]) 09/02/2013   Seborrheic dermatitis of scalp 09/02/2013    Social History: Patient  reports that she has never smoked. She has never used smokeless tobacco. She reports current alcohol use of about 1.0 standard drink per week. She reports that she does not use drugs.  Review of Systems: Ophthalmic: negative for eye pain, loss of vision or double vision Cardiovascular: negative for chest pain Respiratory: negative for SOB or persistent cough Gastrointestinal: negative for abdominal pain Genitourinary: negative for dysuria or gross hematuria MSK: negative for foot lesions Neurologic: negative for weakness or gait disturbance  Objective  Vitals: BP 140/89    Temp 98 F (36.7 C) (Temporal)    Ht 5\' 5"  (1.651 m)    Wt 286 lb 12.8 oz (130.1 kg)    LMP 03/12/2021 (Exact Date)    BMI 47.73 kg/m  General: well appearing, no acute distress  Psych:  Alert and oriented, normal mood and affect but appears slightly stressed     Diabetic education: ongoing education regarding chronic disease management for diabetes was given today. We continue to reinforce the ABC's of diabetic management: A1c (<7 or 8 dependent upon patient), tight blood pressure control, and cholesterol management with goal LDL < 100 minimally. We discuss diet strategies, exercise recommendations, medication options and possible side effects. At each visit, we review recommended immunizations and preventive care recommendations for diabetics and stress that good diabetic control can prevent other problems. See below for this patient's data.   Commons side effects, risks, benefits, and alternatives for medications and treatment plan prescribed today were discussed, and the patient expressed understanding of the  given instructions. Patient is instructed to call or message via MyChart if he/she has any questions or concerns regarding our treatment plan. No barriers to understanding were identified. We discussed Red Flag symptoms and signs in detail. Patient expressed understanding regarding what to do in case of urgent or emergency type symptoms.  Medication list was reconciled, printed and provided to the patient in AVS. Patient instructions and summary information was reviewed with the patient as documented in the AVS. This note was prepared with assistance of Dragon voice recognition software. Occasional wrong-word or sound-a-like substitutions may have occurred due to the inherent limitations of voice recognition software  This visit occurred during the SARS-CoV-2 public health emergency.  Safety protocols were in place, including screening questions prior to the visit, additional usage of staff PPE, and extensive cleaning of exam room while observing appropriate contact time as indicated for disinfecting solutions.

## 2021-03-18 NOTE — Patient Instructions (Signed)
Please return in 3 months for diabetes follow up   If you have any questions or concerns, please don't hesitate to send me a message via MyChart or call the office at [phone]. Thank you for visiting with [place] today! It's our pleasure caring for you.   Diabetes Mellitus and Nutrition, Adult When you have diabetes, or diabetes mellitus, it is very important to have healthy eating habits because your blood sugar (glucose) levels are greatly affected by what you eat and drink. Eating healthy foods in the right amounts, at about the same times every day, can help you: Manage your blood glucose. Lower your risk of heart disease. Improve your blood pressure. Reach or maintain a healthy weight. What can affect my meal plan? Every person with diabetes is different, and each person has different needs for a meal plan. Your health care provider may recommend that you work with a dietitian to make a meal plan that is best for you. Your meal plan may vary depending on factors such as: The calories you need. The medicines you take. Your weight. Your blood glucose, blood pressure, and cholesterol levels. Your activity level. Other health conditions you have, such as heart or kidney disease. How do carbohydrates affect me? Carbohydrates, also called carbs, affect your blood glucose level more than any other type of food. Eating carbs raises the amount of glucose in your blood. It is important to know how many carbs you can safely have in each meal. This is different for every person. Your dietitian can help you calculate how many carbs you should have at each meal and for each snack. How does alcohol affect me? Alcohol can cause a decrease in blood glucose (hypoglycemia), especially if you use insulin or take certain diabetes medicines by mouth. Hypoglycemia can be a life-threatening condition. Symptoms of hypoglycemia, such as sleepiness, dizziness, and confusion, are similar to symptoms of having too much  alcohol. Do not drink alcohol if: Your health care provider tells you not to drink. You are pregnant, may be pregnant, or are planning to become pregnant. If you drink alcohol: Limit how much you have to: 0-1 drink a day for women. 0-2 drinks a day for men. Know how much alcohol is in your drink. In the [place] one drink equals one 12 oz bottle of beer (355 mL), one 5 oz glass of wine (148 mL), or one 1 oz glass of hard liquor (44 mL). Keep yourself hydrated with water, diet soda, or unsweetened iced tea. Keep in mind that regular soda, juice, and other mixers may contain a lot of sugar and must be counted as carbs. What are tips for following this plan? Reading food labels Start by checking the serving size on the Nutrition Facts label of packaged foods and drinks. The number of calories and the amount of carbs, fats, and other nutrients listed on the label are based on one serving of the item. Many items contain more than one serving per package. Check the total grams (g) of carbs in one serving. Check the number of grams of saturated fats and trans fats in one serving. Choose foods that have a low amount or none of these fats. Check the number of milligrams (mg) of salt (sodium) in one serving. Most people should limit total sodium intake to less than 2,300 mg per day. Always check the nutrition information of foods labeled as "low-fat" or "nonfat." These foods may be higher in added sugar or refined carbs and should be avoided.  Talk to your dietitian to identify your daily goals for nutrients listed on the label. Shopping Avoid buying canned, pre-made, or processed foods. These foods tend to be high in fat, sodium, and added sugar. Shop around the outside edge of the grocery store. This is where you will most often find fresh fruits and vegetables, bulk grains, fresh meats, and fresh dairy products. Cooking Use low-heat cooking methods, such as baking, instead of high-heat cooking methods,  such as deep frying. Cook using healthy oils, such as olive, canola, or sunflower oil. Avoid cooking with butter, cream, or high-fat meats. Meal planning Eat meals and snacks regularly, preferably at the same times every day. Avoid going long periods of time without eating. Eat foods that are high in fiber, such as fresh fruits, vegetables, beans, and whole grains. Eat 4-6 oz (112-168 g) of lean protein each day, such as lean meat, chicken, fish, eggs, or tofu. One ounce (oz) (28 g) of lean protein is equal to: 1 oz (28 g) of meat, chicken, or fish. 1 egg.  cup (62 g) of tofu. Eat some foods each day that contain healthy fats, such as avocado, nuts, seeds, and fish. What foods should I eat? Fruits Berries. Apples. Oranges. Peaches. Apricots. Plums. Grapes. Mangoes. Papayas. Pomegranates. Kiwi. Cherries. Vegetables Leafy greens, including lettuce, spinach, kale, chard, collard greens, mustard greens, and cabbage. Beets. Cauliflower. Broccoli. Carrots. Green beans. Tomatoes. Peppers. Onions. Cucumbers. Brussels sprouts. Grains Whole grains, such as whole-wheat or whole-grain bread, crackers, tortillas, cereal, and pasta. Unsweetened oatmeal. Quinoa. Brown or wild rice. Meats and other proteins Seafood. Poultry without skin. Lean cuts of poultry and beef. Tofu. Nuts. Seeds. Dairy Low-fat or fat-free dairy products such as milk, yogurt, and cheese. The items listed above may not be a complete list of foods and beverages you can eat and drink. Contact a dietitian for more information. What foods should I avoid? Fruits Fruits canned with syrup. Vegetables Canned vegetables. Frozen vegetables with butter or cream sauce. Grains Refined white flour and flour products such as bread, pasta, snack foods, and cereals. Avoid all processed foods. Meats and other proteins Fatty cuts of meat. Poultry with skin. Breaded or fried meats. Processed meat. Avoid saturated fats. Dairy Full-fat yogurt,  cheese, or milk. Beverages Sweetened drinks, such as soda or iced tea. The items listed above may not be a complete list of foods and beverages you should avoid. Contact a dietitian for more information. Questions to ask a health care provider Do I need to meet with a certified diabetes care and education specialist? Do I need to meet with a dietitian? What number can I call if I have questions? When are the best times to check my blood glucose? Where to find more information: American Diabetes Association: diabetes.org Academy of Nutrition and Dietetics: eatright.[employer] of Diabetes and Digestive and Kidney Diseases: [employer] Association of Diabetes Care & Education Specialists: diabeteseducator.org Summary It is important to have healthy eating habits because your blood sugar (glucose) levels are greatly affected by what you eat and drink. It is important to use alcohol carefully. A healthy meal plan will help you manage your blood glucose and lower your risk of heart disease. Your health care provider may recommend that you work with a dietitian to make a meal plan that is best for you. This information is not intended to replace advice given to you by your health care provider. Make sure you discuss any questions you have with your health care provider. Document  Revised: 08/31/2019 Document Reviewed: 08/31/2019 Elsevier Patient Education  [place].

## 2021-04-26 ENCOUNTER — Ambulatory Visit: Payer: BC Managed Care – PPO | Admitting: Family Medicine

## 2021-04-29 ENCOUNTER — Ambulatory Visit: Payer: BC Managed Care – PPO | Admitting: Family Medicine

## 2021-06-17 ENCOUNTER — Encounter: Payer: Self-pay | Admitting: Family Medicine

## 2021-06-17 ENCOUNTER — Ambulatory Visit: Payer: BC Managed Care – PPO | Admitting: Family Medicine

## 2021-06-17 VITALS — BP 130/84 | HR 97 | Temp 98.3°F | Ht 65.0 in | Wt 276.4 lb

## 2021-06-17 DIAGNOSIS — I10 Essential (primary) hypertension: Secondary | ICD-10-CM | POA: Diagnosis not present

## 2021-06-17 DIAGNOSIS — D259 Leiomyoma of uterus, unspecified: Secondary | ICD-10-CM | POA: Diagnosis not present

## 2021-06-17 DIAGNOSIS — E119 Type 2 diabetes mellitus without complications: Secondary | ICD-10-CM | POA: Diagnosis not present

## 2021-06-17 LAB — MICROALBUMIN / CREATININE URINE RATIO
Creatinine,U: 68.2 mg/dL
Microalb Creat Ratio: 2.9 mg/g (ref 0.0–30.0)
Microalb, Ur: 2 mg/dL — ABNORMAL HIGH (ref 0.0–1.9)

## 2021-06-17 LAB — POCT GLYCOSYLATED HEMOGLOBIN (HGB A1C): Hemoglobin A1C: 6.8 % — AB (ref 4.0–5.6)

## 2021-06-17 NOTE — Patient Instructions (Signed)
Please return in September for your annual complete physical; please come fasting.  ? ?Good luck with your nutrition! ? ?If you have any questions or concerns, please don't hesitate to send me a message via MyChart or call the office at [phone]. Thank you for visiting with [place] today! It's our pleasure caring for you.  ?

## 2021-06-17 NOTE — Progress Notes (Signed)
? ?Subjective  ?CC:  ?Chief Complaint  ?Patient presents with  ? Follow-up  ?  3 month follow-up on diabetes ?Not fasting ?  ? ? ?HPI: Patricia Chase is a 36 y.o. female who presents to the office today for follow up of diabetes and problems listed above in the chief complaint.  ?Diabetes follow up: Follow-up new onset diabetes.  She is working on improving her diet.  Her weight is down 10 pounds.  She has several questions.  Complains of feeling hungry.  No symptoms of hypoglycemia.  She is not on a statin nor ACE inhibitor.  She does not yet have children and hopes to have that in the future.  She would prefer not to be on medicines for diabetes at this time if possible. ?Hypertension: Fairly well controlled on amlodipine and HCTZ. ?Fibroid uterus: Following along with GYN.  Has multiple large fibroids.  She will see the fertility surgeon next week to discuss myomectomy.  Trying to save reproductive future. ?Obesity: Discussed diet.  She is eating less.  Has questions ? ?Wt Readings from Last 3 Encounters:  ?06/17/21 276 lb 6 oz (125.4 kg)  ?03/18/21 286 lb 12.8 oz (130.1 kg)  ?01/25/21 287 lb 6.4 oz (130.4 kg)  ? ? ?BP Readings from Last 3 Encounters:  ?06/17/21 130/84  ?03/18/21 140/89  ?01/25/21 (!) 148/98  ? ? ?Assessment  ?1. New onset type 2 diabetes mellitus ([place])   ?2. Morbid obesity ([place])   ?3. Essential hypertension   ?4. Uterine leiomyoma, unspecified location   ? ?  ?Plan  ?Diabetes is currently well controlled.  Diet controlled.  Discussed healthy nutrition including Whole Foods plant-based diet, increasing veggies grains and healthy snacks.We will follow-up in 3 to 4 months for recheck.  We discussed indications for statins and ACE inhibitors.  Given fertility and reproductive needs, defer changing medications at this time.  May institute in the future.  However with significant weight loss she could resolve her diabetes. ?Blood pressures well controlled, continue amlodipine and HCTZ.  See  above ?Discussed healthy diet. ?Follow-up with fertility specialist for myomectomy. ? ?Follow up: 3 to 4 months for complete physical and follow-up. ?Orders Placed This Encounter  ?Procedures  ? Microalbumin / creatinine urine ratio  ? POCT HgB A1C  ? ?No orders of the defined types were placed in this encounter. ? ?  ? ?Immunization History  ?Administered Date(s) Administered  ? Influenza,inj,Quad PF,6+ Mos 10/22/2020  ? PFIZER(Purple Top)SARS-COV-2 Vaccination 04/20/2019, 05/18/2019, 12/14/2019  ? Tdap 07/06/2012  ? ? ?Diabetes Related Lab Review: ?Lab Results  ?Component Value Date  ? HGBA1C 6.8 (A) 06/17/2021  ? HGBA1C 6.6 (A) 03/18/2021  ?  ?No results found for: Derl B[place] ?Lab Results  ?Component Value Date  ? CREATININE 0.78 10/22/2020  ? BUN 10 10/22/2020  ? NA 139 10/22/2020  ? K 3.8 10/22/2020  ? CL 102 10/22/2020  ? CO2 26 10/22/2020  ? ?Lab Results  ?Component Value Date  ? CHOL 139 10/22/2020  ? CHOL 126 10/05/2019  ? CHOL 113 10/04/2018  ? ?Lab Results  ?Component Value Date  ? HDL 50.90 10/22/2020  ? HDL 55 10/05/2019  ? HDL 41.80 10/04/2018  ? ?Lab Results  ?Component Value Date  ? [place] 74 10/22/2020  ? [place] 57 10/05/2019  ? [place] 53 10/04/2018  ? ?Lab Results  ?Component Value Date  ? TRIG 74.0 10/22/2020  ? TRIG 55 10/05/2019  ? TRIG 88.0 10/04/2018  ? ?Lab Results  ?Component  Value Date  ? CHOLHDL 3 10/22/2020  ? CHOLHDL 2.3 10/05/2019  ? CHOLHDL 3 10/04/2018  ? ?No results found for: LDLDIRECT ?The ASCVD Risk score (Arnett DK, et al., 2019) failed to calculate for the following reasons: ?  The 2019 ASCVD risk score is only valid for ages 91 to 44 ?I have reviewed the [place], Fam and Soc history. ?Patient Active Problem List  ? Diagnosis Date Noted  ? New onset type 2 diabetes mellitus ([place]) 06/17/2021  ? Essential hypertension 01/25/2021  ? Iron deficiency anemia due to chronic blood loss 01/25/2021  ? Menorrhagia with regular cycle 01/25/2021  ? Oral contraceptive use 01/25/2021   ? Tinea versicolor 07/13/2018  ? Morbid obesity ([place]) 09/02/2013  ? Seborrheic dermatitis of scalp 09/02/2013  ? ? ?Social History: ?Patient  reports that she has never smoked. She has never used smokeless tobacco. She reports current alcohol use of about 1.0 standard drink per week. She reports that she does not use drugs. ? ?Review of Systems: ?Ophthalmic: negative for eye pain, loss of vision or double vision ?Cardiovascular: negative for chest pain ?Respiratory: negative for SOB or persistent cough ?Gastrointestinal: negative for abdominal pain ?Genitourinary: negative for dysuria or gross hematuria ?MSK: negative for foot lesions ?Neurologic: negative for weakness or gait disturbance ? ?Objective  ?Vitals: BP 130/84   Pulse 97   Temp 98.3 ?F (36.8 ?C) (Temporal)   Ht '5\' 5"'$  (1.651 m)   Wt 276 lb 6 oz (125.4 kg)   LMP 06/07/2021 (Exact Date)   SpO2 98%   BMI 45.99 kg/m?  ?General: well appearing, no acute distress  ?Psych:  Alert and oriented, normal mood and affect ?Foot exam: no erythema, pallor, or cyanosis visible nl proprioception and sensation to monofilament testing bilaterally, +2 distal pulses bilaterally ? ? ? ?Diabetic education: ongoing education regarding chronic disease management for diabetes was given today. We continue to reinforce the ABC's of diabetic management: A1c (<7 or 8 dependent upon patient), tight blood pressure control, and cholesterol management with goal LDL < 100 minimally. We discuss diet strategies, exercise recommendations, medication options and possible side effects. At each visit, we review recommended immunizations and preventive care recommendations for diabetics and stress that good diabetic control can prevent other problems. See below for this patient's data. ? ? ?Commons side effects, risks, benefits, and alternatives for medications and treatment plan prescribed today were discussed, and the patient expressed understanding of the given instructions. Patient is  instructed to call or message via MyChart if he/she has any questions or concerns regarding our treatment plan. No barriers to understanding were identified. We discussed Red Flag symptoms and signs in detail. Patient expressed understanding regarding what to do in case of urgent or emergency type symptoms.  ?Medication list was reconciled, printed and provided to the patient in AVS. Patient instructions and summary information was reviewed with the patient as documented in the AVS. ?This note was prepared with assistance of Systems analyst. Occasional wrong-word or sound-a-like substitutions may have occurred due to the inherent limitations of voice recognition software ? ?This visit occurred during the SARS-CoV-2 public health emergency.  Safety protocols were in place, including screening questions prior to the visit, additional usage of staff PPE, and extensive cleaning of exam room while observing appropriate contact time as indicated for disinfecting solutions.  ? ?

## 2021-07-14 ENCOUNTER — Other Ambulatory Visit: Payer: Self-pay | Admitting: Family Medicine

## 2021-07-14 DIAGNOSIS — D509 Iron deficiency anemia, unspecified: Secondary | ICD-10-CM

## 2021-07-22 ENCOUNTER — Other Ambulatory Visit: Payer: Self-pay | Admitting: Obstetrics and Gynecology

## 2021-07-22 DIAGNOSIS — Z319 Encounter for procreative management, unspecified: Secondary | ICD-10-CM

## 2021-07-22 DIAGNOSIS — D250 Submucous leiomyoma of uterus: Secondary | ICD-10-CM

## 2021-07-22 DIAGNOSIS — D251 Intramural leiomyoma of uterus: Secondary | ICD-10-CM

## 2021-08-04 ENCOUNTER — Other Ambulatory Visit: Payer: BC Managed Care – PPO

## 2021-10-30 ENCOUNTER — Encounter: Payer: BC Managed Care – PPO | Admitting: Family Medicine

## 2021-11-04 ENCOUNTER — Encounter: Payer: Self-pay | Admitting: *Deleted

## 2021-11-07 ENCOUNTER — Encounter: Payer: Self-pay | Admitting: Family Medicine

## 2021-11-07 ENCOUNTER — Ambulatory Visit (INDEPENDENT_AMBULATORY_CARE_PROVIDER_SITE_OTHER): Payer: BC Managed Care – PPO | Admitting: Family Medicine

## 2021-11-07 VITALS — BP 130/88 | HR 92 | Temp 98.5°F | Ht 65.0 in | Wt 280.4 lb

## 2021-11-07 DIAGNOSIS — Z0000 Encounter for general adult medical examination without abnormal findings: Secondary | ICD-10-CM

## 2021-11-07 DIAGNOSIS — D500 Iron deficiency anemia secondary to blood loss (chronic): Secondary | ICD-10-CM | POA: Diagnosis not present

## 2021-11-07 DIAGNOSIS — D259 Leiomyoma of uterus, unspecified: Secondary | ICD-10-CM

## 2021-11-07 DIAGNOSIS — Z23 Encounter for immunization: Secondary | ICD-10-CM | POA: Diagnosis not present

## 2021-11-07 DIAGNOSIS — E119 Type 2 diabetes mellitus without complications: Secondary | ICD-10-CM | POA: Diagnosis not present

## 2021-11-07 DIAGNOSIS — I10 Essential (primary) hypertension: Secondary | ICD-10-CM

## 2021-11-07 HISTORY — DX: Leiomyoma of uterus, unspecified: D25.9

## 2021-11-07 LAB — COMPREHENSIVE METABOLIC PANEL
ALT: 34 U/L (ref 0–35)
AST: 26 U/L (ref 0–37)
Albumin: 4.3 g/dL (ref 3.5–5.2)
Alkaline Phosphatase: 83 U/L (ref 39–117)
BUN: 10 mg/dL (ref 6–23)
CO2: 28 mEq/L (ref 19–32)
Calcium: 9.2 mg/dL (ref 8.4–10.5)
Chloride: 100 mEq/L (ref 96–112)
Creatinine, Ser: 0.82 mg/dL (ref 0.40–1.20)
GFR: 92.13 mL/min (ref >60.00)
Glucose, Bld: 260 mg/dL — ABNORMAL HIGH (ref 70–99)
Potassium: 3.6 mEq/L (ref 3.5–5.1)
Sodium: 137 mEq/L (ref 135–145)
Total Bilirubin: 0.2 mg/dL (ref 0.2–1.2)
Total Protein: 7.3 g/dL (ref 6.0–8.3)

## 2021-11-07 LAB — CBC WITH DIFFERENTIAL/PLATELET
Basophils Absolute: 0.1 10*3/uL (ref 0.0–0.1)
Basophils Relative: 0.7 % (ref 0.0–3.0)
Eosinophils Absolute: 0.1 10*3/uL (ref 0.0–0.7)
Eosinophils Relative: 0.8 % (ref 0.0–5.0)
HCT: 41.2 % (ref 36.0–46.0)
Hemoglobin: 13.9 g/dL (ref 12.0–15.0)
Lymphocytes Relative: 30.3 % (ref 12.0–46.0)
Lymphs Abs: 2.5 10*3/uL (ref 0.7–4.0)
MCHC: 33.8 g/dL (ref 30.0–36.0)
MCV: 84.7 fl (ref 78.0–100.0)
Monocytes Absolute: 0.5 10*3/uL (ref 0.1–1.0)
Monocytes Relative: 5.5 % (ref 3.0–12.0)
Neutro Abs: 5.1 10*3/uL (ref 1.4–7.7)
Neutrophils Relative %: 62.7 % (ref 43.0–77.0)
Platelets: 293 10*3/uL (ref 150.0–400.0)
RBC: 4.87 Mil/uL (ref 3.87–5.11)
RDW: 13 % (ref 11.5–15.5)
WBC: 8.2 10*3/uL (ref 4.0–10.5)

## 2021-11-07 LAB — LIPID PANEL
Cholesterol: 149 mg/dL (ref 0–200)
HDL: 45.1 mg/dL (ref >39.00)
LDL Cholesterol: 85 mg/dL (ref 0–99)
NonHDL: 103.49
Total CHOL/HDL Ratio: 3
Triglycerides: 91 mg/dL (ref 0.0–149.0)
VLDL: 18.2 mg/dL (ref 0.0–40.0)

## 2021-11-07 LAB — TSH: TSH: 4.26 u[IU]/mL (ref 0.35–5.50)

## 2021-11-07 LAB — HEMOGLOBIN A1C: Hgb A1c MFr Bld: 9 % — ABNORMAL HIGH (ref 4.6–6.5)

## 2021-11-07 NOTE — Patient Instructions (Signed)
Please return in 6 months to recheck diabetes and hypertension.  I will release your lab results to you on your MyChart account with further instructions. You may see the results before I do, but when I review them I will send you a message with my report or have my assistant call you if things need to be discussed. Please reply to my message with any questions. Thank you!   Today you were given your flu and Prevnar 20 vaccines.   If you have any questions or concerns, please don't hesitate to send me a message via MyChart or call the office at [phone]. Thank you for visiting with [place] today! It's our pleasure caring for you.   Please do these things to maintain good health!  Exercise at least 30-45 minutes a day,  4-5 days a week.  Eat a low-fat diet with lots of fruits and vegetables, up to 7-9 servings per day. Drink plenty of water daily. Try to drink 8 8oz glasses per day. Seatbelts can save your life. Always wear your seatbelt. Place Smoke Detectors on every level of your home and check batteries every year. Schedule an appointment with an eye doctor for an eye exam every 1-2 years Safe sex - use condoms to protect yourself from STDs if you could be exposed to these types of infections. Use birth control if you do not want to become pregnant and are sexually active. Avoid heavy alcohol use. If you drink, keep it to less than 2 drinks/day and not every day. [place].  Choose someone you trust that could speak for you if you became unable to speak for yourself. Depression is common in our stressful world.If you're feeling down or losing interest in things you normally enjoy, please come in for a visit. If anyone is threatening or hurting you, please get help. Physical or Emotional Violence is never OK.

## 2021-11-07 NOTE — Progress Notes (Signed)
Subjective  Chief Complaint  Patient presents with   Annual Exam    Pt here for Annual Exam and is currently fasting     HPI: Patricia Chase is a 36 y.o. female who presents to [facility] today for a Female Wellness Visit. She also has the concerns and/or needs as listed above in the chief complaint. These will be addressed in addition to the Health Maintenance Visit.   Wellness Visit: annual visit with health maintenance review and exam without Pap  HM: pap screen current. Seeing fertility specialist. Eligible for prevnar 20 due to diabetes. Flu shot today as well. Doing well. Had a good summer. Back to work: busy schedule. Chronic disease f/u and/or acute problem visit: (deemed necessary to be done in addition to the wellness visit): DM: feels well w/o sxs of hyperglycemia. Due for recheck.  HTN on amlodipine and hctz; not on ace due to reproductive efforts. Minimal microalbuminuria.  HLD deferring statin.  Obesity: discussed diet. Schedule limits ability to meal prep/cook. Would like to do better.  Uterine fibroids/ 15week uterus and h/o menorrhagia and IDA; on iron and hormones to control bleeding; improving. Per gyn.  Assessment  1. Annual physical exam   2. New onset type 2 diabetes mellitus ([place])   3. Iron deficiency anemia due to chronic blood loss   4. Essential hypertension   5. Morbid obesity ([place])   6. Need for immunization against influenza   7. Uterine leiomyoma, unspecified location   8. Need for pneumococcal 20-valent conjugate vaccination      Plan  Female Wellness Visit: Age appropriate Health Maintenance and Prevention measures were discussed with patient. Included topics are cancer screening recommendations, ways to keep healthy (see AVS) including dietary and exercise recommendations, regular eye and dental care, use of seat belts, and avoidance of moderate alcohol use and tobacco use. Screens current BMI: discussed patient's BMI and  encouraged positive lifestyle modifications to help get to or maintain a target BMI. HM needs and immunizations were addressed and ordered. See below for orders. See HM and immunization section for updates. Flu and prevnar 20 today Routine labs and screening tests ordered including cmp, cbc and lipids where appropriate. Discussed recommendations regarding Vit D and calcium supplementation (see AVS)  Chronic disease management visit and/or acute problem visit: DM: recheck a1c. Education about easy healthy food options. Rec exercise. Holding ace and statin until after child bearing efforts are established. Start metformin if a1c is elevated > 7.0 HTN is controlled. Check lytes. Cont amlodpine 10 and hctz 25. Monitor renal and lytes Recheck nonfasting lipids Work on weight loss and diet F/u with fertility specialist to discuss myomectomy.   Follow up: Return in about 6 months (around 05/08/2022) for follow up of diabetes and hypertension.  Orders Placed This Encounter  Procedures   Flu Vaccine QUAD 40moIM (Fluarix, Fluzone & Alfiuria Quad PF)   Pneumococcal conjugate vaccine 20-valent (Prevnar 20)   CBC with Differential/Platelet   Comprehensive metabolic panel   Hemoglobin A1c   Lipid panel   TSH   No orders of the defined types were placed in this encounter.     Body mass index is 46.66 kg/m. Wt Readings from Last 3 Encounters:  11/07/21 280 lb 6.4 oz (127.2 kg)  06/17/21 276 lb 6 oz (125.4 kg)  03/18/21 286 lb 12.8 oz (130.1 kg)     Patient Active Problem List   Diagnosis Date Noted   Fibroid uterus 11/07/2021  New onset type 2 diabetes mellitus ([place]) 06/17/2021   Essential hypertension 01/25/2021   Iron deficiency anemia due to chronic blood loss 01/25/2021   Menorrhagia with regular cycle 01/25/2021   Oral contraceptive use 01/25/2021   Tinea versicolor 07/13/2018   Morbid obesity ([place]) 09/02/2013   Seborrheic dermatitis of scalp 09/02/2013   Health Maintenance   Topic Date Due   Diabetic kidney evaluation - GFR measurement  10/22/2021   COVID-19 Vaccine (4 - Pfizer series) 11/20/2021 (Originally 02/08/2020)   HEMOGLOBIN A1C  12/18/2021   OPHTHALMOLOGY EXAM  03/14/2022   Diabetic kidney evaluation - Urine ACR  06/18/2022   FOOT EXAM  06/18/2022   TETANUS/TDAP  07/07/2022   PAP SMEAR-Modifier  10/04/2023   INFLUENZA VACCINE  Completed   Hepatitis C Screening  Completed   HIV Screening  Completed   HPV VACCINES  Aged Out   Immunization History  Administered Date(s) Administered   Influenza,inj,Quad PF,6+ Mos 10/22/2020, 11/07/2021   PFIZER(Purple Top)SARS-COV-2 Vaccination 04/20/2019, 05/18/2019, 12/14/2019   Tdap 07/06/2012   We updated and reviewed the patient's past history in detail and it is documented below. Allergies: Patient has No Known Allergies. Past Medical History Patient  has a past medical history of Allergy, Chicken pox, Eczema, and Fibroid uterus (11/07/2021). Past Surgical History Patient  has a past surgical history that includes Wisdom tooth extraction. Family History: Patient family history includes Cerebral palsy in her brother; Diabetes in her maternal grandmother and mother; Heart disease in her father and paternal grandfather; Hypertension in her father, maternal grandmother, mother, paternal grandfather, and paternal grandmother; Seizures in her brother; Thyroid disease in her mother. Social History:  Patient  reports that she has never smoked. She has never used smokeless tobacco. She reports current alcohol use of about 1.0 standard drink of alcohol per week. She reports that she does not use drugs.  Review of Systems: Constitutional: negative for fever or malaise Ophthalmic: negative for photophobia, double vision or loss of vision Cardiovascular: negative for chest pain, dyspnea on exertion, or new LE swelling Respiratory: negative for SOB or persistent cough Gastrointestinal: negative for abdominal pain,  change in bowel habits or melena Genitourinary: negative for dysuria or gross hematuria, no abnormal uterine bleeding or disharge Musculoskeletal: negative for new gait disturbance or muscular weakness Integumentary: negative for new or persistent rashes, no breast lumps Neurological: negative for TIA or stroke symptoms Psychiatric: negative for SI or delusions Allergic/Immunologic: negative for hives  Patient Care Team    Relationship Specialty Notifications Start End  Leamon Arnt, MD PCP - General Family Medicine  06/23/17   Loney Loh, MD  Dermatology  06/23/17     Objective  Vitals: BP 130/88   Pulse 92   Temp 98.5 F (36.9 C)   Ht '5\' 5"'$  (1.651 m)   Wt 280 lb 6.4 oz (127.2 kg)   SpO2 96%   BMI 46.66 kg/m  General:  Well developed, well nourished, no acute distress  Psych:  Alert and orientedx3,normal mood and affect HEENT:  Normocephalic, atraumatic, non-icteric sclera,  supple neck without adenopathy, mass or thyromegaly Cardiovascular:  Normal S1, S2, RRR without gallop, rub or murmur Respiratory:  Good breath sounds bilaterally, CTAB with normal respiratory effort Gastrointestinal: normal bowel sounds, soft, non-tender, no noted masses. No HSM MSK: no deformities, contusions. Joints are without erythema or swelling.  Skin:  Warm, no rashes or suspicious lesions noted Neurologic:    Mental status is normal. CN 2-11 are normal. Gross motor and  sensory exams are normal. Normal gait. No tremor   Commons side effects, risks, benefits, and alternatives for medications and treatment plan prescribed today were discussed, and the patient expressed understanding of the given instructions. Patient is instructed to call or message via MyChart if he/she has any questions or concerns regarding our treatment plan. No barriers to understanding were identified. We discussed Red Flag symptoms and signs in detail. Patient expressed understanding regarding what to do in case of urgent  or emergency type symptoms.  Medication list was reconciled, printed and provided to the patient in AVS. Patient instructions and summary information was reviewed with the patient as documented in the AVS. This note was prepared with assistance of Dragon voice recognition software. Occasional wrong-word or sound-a-like substitutions may have occurred due to the inherent limitations of voice recognition software

## 2021-11-08 MED ORDER — METFORMIN HCL 500 MG PO TABS: ORAL_TABLET | ORAL | 0 refills | Status: DC

## 2021-11-08 NOTE — Addendum Note (Signed)
Addended by: Billey Chang on: 11/08/2021 01:01 PM   Modules accepted: Orders

## 2021-11-12 ENCOUNTER — Other Ambulatory Visit: Payer: Self-pay

## 2021-11-12 DIAGNOSIS — E119 Type 2 diabetes mellitus without complications: Secondary | ICD-10-CM

## 2022-01-14 ENCOUNTER — Other Ambulatory Visit: Payer: Self-pay | Admitting: Family Medicine

## 2022-01-16 ENCOUNTER — Other Ambulatory Visit: Payer: Self-pay | Admitting: Family Medicine

## 2022-02-14 LAB — HM DIABETES EYE EXAM

## 2022-03-24 LAB — HM PAP SMEAR

## 2022-05-04 ENCOUNTER — Other Ambulatory Visit: Payer: Self-pay | Admitting: Family Medicine

## 2022-05-12 ENCOUNTER — Encounter: Payer: Self-pay | Admitting: Family Medicine

## 2022-05-12 ENCOUNTER — Ambulatory Visit: Payer: BC Managed Care – PPO | Admitting: Family Medicine

## 2022-05-12 VITALS — BP 120/86 | HR 101 | Temp 97.1°F | Ht 65.0 in | Wt 270.6 lb

## 2022-05-12 DIAGNOSIS — D500 Iron deficiency anemia secondary to blood loss (chronic): Secondary | ICD-10-CM | POA: Diagnosis not present

## 2022-05-12 DIAGNOSIS — F430 Acute stress reaction: Secondary | ICD-10-CM

## 2022-05-12 DIAGNOSIS — E119 Type 2 diabetes mellitus without complications: Secondary | ICD-10-CM | POA: Diagnosis not present

## 2022-05-12 DIAGNOSIS — I10 Essential (primary) hypertension: Secondary | ICD-10-CM

## 2022-05-12 LAB — POCT GLYCOSYLATED HEMOGLOBIN (HGB A1C): Hemoglobin A1C: 6.5 % — AB (ref 4.0–5.6)

## 2022-05-12 MED ORDER — LISINOPRIL-HYDROCHLOROTHIAZIDE 10-12.5 MG PO TABS
1.0000 | ORAL_TABLET | Freq: Every day | ORAL | 3 refills | Status: DC
Start: 2022-05-12 — End: 2022-08-11

## 2022-05-12 MED ORDER — METFORMIN HCL 1000 MG PO TABS: 1000.0000 mg | ORAL_TABLET | Freq: Two times a day (BID) | ORAL | 3 refills | Status: DC

## 2022-05-12 NOTE — Patient Instructions (Signed)
Please return in 3 months for diabetes follow up and blood pressure follow up.  I have ordered the 1000mg  dose of metformin to be taken twice a day. And stop the hctz; I have ordered lisinopril hctz to be taken daily for your blood pressure in addition to the amlodipine.   Reach out to EAP to see if they can help you manage your stress.  If you have any questions or concerns, please don't hesitate to send me a message via MyChart or call the office at [phone]. Thank you for visiting with [place] today! It's our pleasure caring for you.

## 2022-05-12 NOTE — Progress Notes (Signed)
Subjective  CC:  Chief Complaint  Patient presents with   Diabetes   Hypertension    HPI: Patricia Chase is a 37 y.o. female who presents to the office today for follow up of diabetes and problems listed above in the chief complaint.  Diabetes follow up: Her diabetic control is reported as Improved. Tolerating metformin 1000 bid w/o side effects. Diets is fair. No complications.  She denies exertional CP or SOB or symptomatic hypoglycemia. She denies foot sores or paresthesias.  HTN: on amlodipine 10and hctz 12.5. not currently trying to get pregnant. Bp is fairly well controlled. No adverse effects. No cp or sob. Reports bp was better at recent gyn visit. Doesn't check at [facility] Work stress: overwhelmed recently. Feeling anxious. Occ affects sleep but no depressive sxs or panic sxs. Feeling unmotivated after work and very tired. No sweats or skin or hair changes. Sleep is mostly good. No h/o mood disorder. 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  HLD not on statin due to child bearing age  Fertility/fibroids: scheduled for myomectomy in July. Reviewed recent ov and CBC. On iron daily and anemia has resolved. Not currently trying to get pregnant. May again try in the future.   Wt Readings from Last 3 Encounters:  05/12/22 270 lb 9.6 oz (122.7 kg)  11/07/21 280 lb 6.4 oz (127.2 kg)  06/17/21 276 lb 6 oz (125.4 kg)    BP Readings from Last 3 Encounters:  05/12/22 120/86  11/07/21 130/88  06/17/21 130/84    Assessment  1. Controlled type 2 diabetes mellitus without complication, without long-term current use of insulin   2. Essential hypertension   3. Morbid obesity Chronic  4. Iron deficiency anemia due to chronic blood loss   5. Stress reaction      Plan  Diabetes is currently very well controlled. Continue met 1000 bid for now. After surgery, will discuss change to glp-1 for diabetes  and weight loss.  HTN and diabetes: not trying to become pregnant now. Will start ace, add it to hctz and amlodipine for better control. Should get her to goal.  Obesity: stressed now. Eat diabetic diet. Would be good glp-1 candidate Anemia and fibroids: for surgery. Continue iron daily. Waiting on lupron approval  Reproductive efforts currently on hold.  Stress reaction; counseling done. Rec EAP consult through her workplace to start helping manage work stress. F/u with me if needed. No indication for medication therapy at this time. Discussed self care.  I spent a total of 43 minutes for this patient encounter. Time spent included preparation, face-to-face counseling with the patient and coordination of care, review of chart and records, and documentation of the encounter.  Follow up: 3 mo for bp and diabetes fu. Orders Placed This Encounter  Procedures   POCT HgB A1C   Meds ordered this encounter  Medications   metFORMIN (GLUCOPHAGE) 1000 MG tablet    Sig: Take 1 tablet (1,000 mg total) by mouth 2 (two) times daily with a meal.    Dispense:  180 tablet    Refill:  3   lisinopril-hydrochlorothiazide (ZESTORETIC) 10-12.5 MG tablet    Sig: Take 1 tablet by mouth daily.    Dispense:  90 tablet    Refill:  3      Immunization History  Administered Date(s) Administered   Influenza,inj,Quad PF,6+ Mos 10/22/2020, 11/07/2021   PFIZER(Purple  Top)SARS-COV-2 Vaccination 04/20/2019, 05/18/2019, 12/14/2019   PNEUMOCOCCAL CONJUGATE-20 11/07/2021   Tdap 07/06/2012    Diabetes Related Lab Review: Lab Results  Component Value Date   HGBA1C 6.5 (A) 05/12/2022   HGBA1C 9.0 (H) 11/07/2021   HGBA1C 6.8 (A) 06/17/2021    Lab Results  Component Value Date   MICROALBUR 2.0 (H) 06/17/2021   Lab Results  Component Value Date   CREATININE 0.82 11/07/2021   BUN 10 11/07/2021   NA 137 11/07/2021   K 3.6 11/07/2021   CL 100 11/07/2021   CO2 28 11/07/2021   Lab Results  Component Value  Date   CHOL 149 11/07/2021   CHOL 139 10/22/2020   CHOL 126 10/05/2019   Lab Results  Component Value Date   HDL 45.10 11/07/2021   HDL 50.90 10/22/2020   HDL 55 10/05/2019   Lab Results  Component Value Date   LDLCALC 85 11/07/2021   LDLCALC 74 10/22/2020   LDLCALC 57 10/05/2019   Lab Results  Component Value Date   TRIG 91.0 11/07/2021   TRIG 74.0 10/22/2020   TRIG 55 10/05/2019   Lab Results  Component Value Date   CHOLHDL 3 11/07/2021   CHOLHDL 3 10/22/2020   CHOLHDL 2.3 10/05/2019   No results found for: "LDLDIRECT" The ASCVD Risk score (Arnett DK, et al., 2019) failed to calculate for the following reasons:   The 2019 ASCVD risk score is only valid for ages 27 to 87 I have reviewed the [place], Fam and Soc history. Patient Active Problem List   Diagnosis Date Noted   Fibroid uterus 11/07/2021   Controlled type 2 diabetes mellitus without complication, without long-term current use of insulin 06/17/2021   Essential hypertension 01/25/2021   Iron deficiency anemia due to chronic blood loss 01/25/2021   Menorrhagia with regular cycle 01/25/2021   Oral contraceptive use 01/25/2021   Tinea versicolor 07/13/2018   Morbid obesity 09/02/2013   Seborrheic dermatitis of scalp 09/02/2013    Social History: Patient  reports that she has never smoked. She has never used smokeless tobacco. She reports current alcohol use of about 1.0 standard drink of alcohol per week. She reports that she does not use drugs.  Review of Systems: Ophthalmic: negative for eye pain, loss of vision or double vision Cardiovascular: negative for chest pain Respiratory: negative for SOB or persistent cough Gastrointestinal: negative for abdominal pain Genitourinary: negative for dysuria or gross hematuria MSK: negative for foot lesions Neurologic: negative for weakness or gait disturbance  Objective  Vitals: BP 120/86   Pulse (!) 101   Temp (!) 97.1 F (36.2 C)   Ht 5\' 5"  (1.651 m)   Wt  270 lb 9.6 oz (122.7 kg)   SpO2 97%   BMI 45.03 kg/m  General: well appearing, no acute distress  Psych:  Alert and oriented, anxious mood and affect HEENT:  Normocephalic, atraumatic, moist mucous membranes, supple neck  Cardiovascular:  Nl S1 and S2, RRR without murmur, gallop or rub. no edema Respiratory:  Good breath sounds bilaterally, CTAB with normal effort, no rales     Diabetic education: ongoing education regarding chronic disease management for diabetes was given today. We continue to reinforce the ABC's of diabetic management: A1c (<7 or 8 dependent upon patient), tight blood pressure control, and cholesterol management with goal LDL < 100 minimally. We discuss diet strategies, exercise recommendations, medication options and possible side effects. At each visit, we review recommended immunizations and preventive care recommendations for diabetics and stress  that good diabetic control can prevent other problems. See below for this patient's data.   Commons side effects, risks, benefits, and alternatives for medications and treatment plan prescribed today were discussed, and the patient expressed understanding of the given instructions. Patient is instructed to call or message via MyChart if he/she has any questions or concerns regarding our treatment plan. No barriers to understanding were identified. We discussed Red Flag symptoms and signs in detail. Patient expressed understanding regarding what to do in case of urgent or emergency type symptoms.  Medication list was reconciled, printed and provided to the patient in AVS. Patient instructions and summary information was reviewed with the patient as documented in the AVS. This note was prepared with assistance of Dragon voice recognition software. Occasional wrong-word or sound-a-like substitutions may have occurred due to the inherent limitations of voice recognition software

## 2022-07-23 ENCOUNTER — Other Ambulatory Visit: Payer: Self-pay | Admitting: Obstetrics and Gynecology

## 2022-07-23 DIAGNOSIS — Z319 Encounter for procreative management, unspecified: Secondary | ICD-10-CM

## 2022-07-23 DIAGNOSIS — D250 Submucous leiomyoma of uterus: Secondary | ICD-10-CM

## 2022-07-23 DIAGNOSIS — D251 Intramural leiomyoma of uterus: Secondary | ICD-10-CM

## 2022-08-11 ENCOUNTER — Encounter: Payer: Self-pay | Admitting: Family Medicine

## 2022-08-11 ENCOUNTER — Ambulatory Visit: Payer: BC Managed Care – PPO | Admitting: Family Medicine

## 2022-08-11 VITALS — BP 119/81 | HR 91 | Temp 97.7°F | Ht 65.0 in | Wt 269.8 lb

## 2022-08-11 DIAGNOSIS — E1159 Type 2 diabetes mellitus with other circulatory complications: Secondary | ICD-10-CM | POA: Diagnosis not present

## 2022-08-11 DIAGNOSIS — I152 Hypertension secondary to endocrine disorders: Secondary | ICD-10-CM

## 2022-08-11 DIAGNOSIS — Z7984 Long term (current) use of oral hypoglycemic drugs: Secondary | ICD-10-CM

## 2022-08-11 DIAGNOSIS — E119 Type 2 diabetes mellitus without complications: Secondary | ICD-10-CM

## 2022-08-11 LAB — POCT GLYCOSYLATED HEMOGLOBIN (HGB A1C): Hemoglobin A1C: 6.4 % — AB (ref 4.0–5.6)

## 2022-08-11 MED ORDER — LISINOPRIL-HYDROCHLOROTHIAZIDE 20-12.5 MG PO TABS: 1.0000 | ORAL_TABLET | Freq: Every day | ORAL | 3 refills | Status: DC

## 2022-08-11 NOTE — Progress Notes (Signed)
Subjective  CC:  Chief Complaint  Patient presents with   Follow-up    3 month follow up , pt is here to follow up regarding b/p and diabetes , she hasn't checking this at  home, she stated that she hasn't missed any dose of medication,pt has no new concerns    HPI: Patricia Chase is a 37 y.o. female who presents to the office today for follow up of diabetes, hypertension and problems listed above in the chief complaint.  Diabetic f/u: Her diabetic control is reported as Unchanged. Tolerating met 1000 bid. She denies exertional CP or SOB or symptomatic hypoglycemia. She denies foot sores.   Hypertension f/u: Control is fair . Pt reports she is doing well. taking medications as instructed, no medication side effects noted, no TIAs, no chest pain on exertion, no dyspnea on exertion, no swelling of ankles. Had a few elevated readings at gyn office: on amlodipine 10 and zestoretic 10/12.5.  She denies adverse effects from his BP medications. Compliance with medication is good.   Hyperlipidemia f/u: had pretty good lipid panel; not on statin due to reproductive desires.    Fibroids: started lupron last week; scheduled for myomectomy in September. On iron.  C/o thin nails and brittle hair.    Assessment  1. Controlled type 2 diabetes mellitus without complication, without long-term current use of insulin (HCC)   2. Diabetes mellitus treated with oral medication (HCC)   3. Hypertension associated with diabetes (HCC)   4. Morbid obesity (HCC)      Plan  Diabetes is currently very well controlled. Cont met 1000 bid. Consider glp-1 in future Hypertension is currently marginally controlled. Increase dose of zestoretic up to 20/12.5 with amlodipine 10 Hyperlipidemia f/u:  will monitor and start statin when able.  Thinning nails/hair: start biotin and zinc x 6 weeks.  Diabetic education: ongoing education regarding chronic disease management for diabetes was given today. We continue to reinforce  the ABC's of diabetic management: A1c (<7 or 8 dependent upon patient), tight blood pressure control, and cholesterol management with goal LDL < 100 minimally. We discuss diet strategies, exercise recommendations, medication options and possible side effects. At each visit, we review recommended immunizations and preventive care recommendations for diabetics and stress that good diabetic control can prevent other problems. See below for this patient's data. Hypertension education: ongoing education regarding management of these chronic disease states was given. Management strategies discussed on successive visits include dietary and exercise recommendations, goals of achieving and maintaining IBW, and lifestyle modifications aiming for adequate sleep and minimizing stressors.   Follow up: No follow-ups on file..  Orders Placed This Encounter  Procedures   POCT glycosylated hemoglobin (Hb A1C)   Meds ordered this encounter  Medications   lisinopril-hydrochlorothiazide (ZESTORETIC) 20-12.5 MG tablet    Sig: Take 1 tablet by mouth daily.    Dispense:  90 tablet    Refill:  3      I reviewed the patients updated PMH, FH, and SocHx.  Patient Active Problem List   Diagnosis Date Noted   Fibroid uterus 11/07/2021   Controlled type 2 diabetes mellitus without complication, without long-term current use of insulin (HCC) 06/17/2021   Hypertension associated with diabetes (HCC) 01/25/2021   Iron deficiency anemia due to chronic blood loss 01/25/2021   Menorrhagia with regular cycle 01/25/2021   Oral contraceptive use 01/25/2021   Tinea versicolor 07/13/2018   Morbid obesity (HCC) 09/02/2013   Seborrheic dermatitis of scalp 09/02/2013   Immunization  History  Administered Date(s) Administered   Influenza,inj,Quad PF,6+ Mos 10/22/2020, 11/07/2021   PFIZER(Purple Top)SARS-COV-2 Vaccination 04/20/2019, 05/18/2019, 12/14/2019   PNEUMOCOCCAL CONJUGATE-20 11/07/2021   Tdap 07/06/2012   Health  Maintenance  Topic Date Due   DTaP/Tdap/Td (2 - Td or Tdap) 07/07/2022   COVID-19 Vaccine (4 - 2023-24 season) 08/27/2022 (Originally 10/11/2021)   Diabetic kidney evaluation - Urine ACR  12/09/2022 (Originally 06/18/2022)   INFLUENZA VACCINE  09/11/2022   Diabetic kidney evaluation - eGFR measurement  11/08/2022   HEMOGLOBIN A1C  02/11/2023   OPHTHALMOLOGY EXAM  02/15/2023   FOOT EXAM  08/11/2023   PAP SMEAR-Modifier  10/04/2023   Hepatitis C Screening  Completed   HIV Screening  Completed   HPV VACCINES  Aged Out   Diabetes and HTN Related Lab Review: Lab Results  Component Value Date   HGBA1C 6.4 (A) 08/11/2022   HGBA1C 6.5 (A) 05/12/2022   HGBA1C 9.0 (H) 11/07/2021    Lab Results  Component Value Date   MICROALBUR 2.0 (H) 06/17/2021   Lab Results  Component Value Date   CREATININE 0.82 11/07/2021   BUN 10 11/07/2021   NA 137 11/07/2021   K 3.6 11/07/2021   CL 100 11/07/2021   CO2 28 11/07/2021   Lab Results  Component Value Date   CHOL 149 11/07/2021   CHOL 139 10/22/2020   CHOL 126 10/05/2019   Lab Results  Component Value Date   HDL 45.10 11/07/2021   HDL 50.90 10/22/2020   HDL 55 10/05/2019   Lab Results  Component Value Date   LDLCALC 85 11/07/2021   LDLCALC 74 10/22/2020   LDLCALC 57 10/05/2019   Lab Results  Component Value Date   TRIG 91.0 11/07/2021   TRIG 74.0 10/22/2020   TRIG 55 10/05/2019   Lab Results  Component Value Date   CHOLHDL 3 11/07/2021   CHOLHDL 3 10/22/2020   CHOLHDL 2.3 10/05/2019   No results found for: "LDLDIRECT" The ASCVD Risk score (Arnett DK, et al., 2019) failed to calculate for the following reasons:   The 2019 ASCVD risk score is only valid for ages 84 to 102  BP Readings from Last 3 Encounters:  08/11/22 119/81  05/12/22 120/86  11/07/21 130/88   Wt Readings from Last 3 Encounters:  08/11/22 269 lb 12.8 oz (122.4 kg)  05/12/22 270 lb 9.6 oz (122.7 kg)  11/07/21 280 lb 6.4 oz (127.2 kg)     Allergies: Patient has No Known Allergies. Family History: Patient family history includes Cerebral palsy in her brother; Diabetes in her maternal grandmother and mother; Heart disease in her father and paternal grandfather; Hypertension in her father, maternal grandmother, mother, paternal grandfather, and paternal grandmother; Seizures in her brother; Thyroid disease in her mother. Social History:  Patient  reports that she has never smoked. She has never used smokeless tobacco. She reports current alcohol use of about 1.0 standard drink of alcohol per week. She reports that she does not use drugs.  Review of Systems: Ophthalmic: negative for eye pain, loss of vision or double vision Cardiovascular: negative for chest pain Respiratory: negative for SOB or persistent cough Gastrointestinal: negative for abdominal pain Genitourinary: negative for dysuria or gross hematuria MSK: negative for foot lesions Neurologic: negative for weakness or gait disturbance Current Meds  Medication Sig   amLODipine (NORVASC) 10 MG tablet TAKE 1 TABLET BY MOUTH EVERY DAY   Ciclopirox 1 % shampoo APPLY TO SCALP AND SHAMPOO. REPEAT EVERY WEEK TO EVERY OTHER WEEK  ferrous sulfate 325 (65 FE) MG tablet TAKE 325 MG BY MOUTH 2 (TWO) TIMES DAILY.   Fluocinolone Acetonide (DERMA-SMOOTHE/FS BODY) 0.01 % OIL Apply 1 application topically 3 (three) times a week.   fluticasone (FLONASE) 50 MCG/ACT nasal spray SPRAY 2 SPRAYS INTO EACH NOSTRIL EVERY DAY   lisinopril-hydrochlorothiazide (ZESTORETIC) 20-12.5 MG tablet Take 1 tablet by mouth daily.   loratadine (CLARITIN) 10 MG tablet Take 10 mg by mouth daily.   metFORMIN (GLUCOPHAGE) 1000 MG tablet Take 1 tablet (1,000 mg total) by mouth 2 (two) times daily with a meal.   Multiple Vitamin (MULTIVITAMIN WITH MINERALS) TABS Take 1 tablet by mouth daily.   [DISCONTINUED] lisinopril-hydrochlorothiazide (ZESTORETIC) 10-12.5 MG tablet Take 1 tablet by mouth daily.     Objective  Vitals: BP 119/81   Pulse 91   Temp 97.7 F (36.5 C)   Ht 5\' 5"  (1.651 m)   Wt 269 lb 12.8 oz (122.4 kg)   LMP  (LMP Unknown)   SpO2 100%   BMI 44.90 kg/m  General: well appearing, no acute distress  Psych:  Alert and oriented, normal mood and affect Cardiovascular:  Nl S1 and S2, RRR without murmur, gallop or rub. no edema Respiratory:  Good breath sounds bilaterally, CTAB with normal effort, no rales Neurologic:   Mental status is normal. normal gait Foot exam: no erythema, pallor, or cyanosis visible nl proprioception and sensation to monofilament testing bilaterally, +2 distal pulses bilaterally  Commons side effects, risks, benefits, and alternatives for medications and treatment plan prescribed today were discussed, and the patient expressed understanding of the given instructions. Patient is instructed to call or message via MyChart if he/she has any questions or concerns regarding our treatment plan. No barriers to understanding were identified. We discussed Red Flag symptoms and signs in detail. Patient expressed understanding regarding what to do in case of urgent or emergency type symptoms.  Medication list was reconciled, printed and provided to the patient in AVS. Patient instructions and summary information was reviewed with the patient as documented in the AVS. This note was prepared with assistance of Dragon voice recognition software. Occasional wrong-word or sound-a-like substitutions may have occurred due to the inherent limitations of voice recognition software

## 2022-08-11 NOTE — Patient Instructions (Signed)
Please return in 3 months for cpe  If you have any questions or concerns, please don't hesitate to send me a message via MyChart or call the office at [phone]. Thank you for visiting with us today! It's our pleasure caring for you.  

## 2022-12-17 ENCOUNTER — Encounter: Payer: BC Managed Care – PPO | Admitting: Family Medicine

## 2023-01-11 ENCOUNTER — Other Ambulatory Visit: Payer: Self-pay | Admitting: Family Medicine

## 2023-02-13 ENCOUNTER — Ambulatory Visit (INDEPENDENT_AMBULATORY_CARE_PROVIDER_SITE_OTHER): Payer: 59 | Admitting: Family Medicine

## 2023-02-13 ENCOUNTER — Encounter: Payer: Self-pay | Admitting: Family Medicine

## 2023-02-13 VITALS — BP 118/84 | HR 111 | Temp 97.8°F | Ht 65.0 in | Wt 273.8 lb

## 2023-02-13 DIAGNOSIS — E119 Type 2 diabetes mellitus without complications: Secondary | ICD-10-CM

## 2023-02-13 DIAGNOSIS — N920 Excessive and frequent menstruation with regular cycle: Secondary | ICD-10-CM

## 2023-02-13 DIAGNOSIS — Z23 Encounter for immunization: Secondary | ICD-10-CM

## 2023-02-13 DIAGNOSIS — Z6841 Body Mass Index (BMI) 40.0 and over, adult: Secondary | ICD-10-CM

## 2023-02-13 DIAGNOSIS — Z7984 Long term (current) use of oral hypoglycemic drugs: Secondary | ICD-10-CM | POA: Diagnosis not present

## 2023-02-13 DIAGNOSIS — Z9889 Other specified postprocedural states: Secondary | ICD-10-CM

## 2023-02-13 DIAGNOSIS — I152 Hypertension secondary to endocrine disorders: Secondary | ICD-10-CM | POA: Diagnosis not present

## 2023-02-13 DIAGNOSIS — Z0000 Encounter for general adult medical examination without abnormal findings: Secondary | ICD-10-CM

## 2023-02-13 DIAGNOSIS — E1159 Type 2 diabetes mellitus with other circulatory complications: Secondary | ICD-10-CM

## 2023-02-13 DIAGNOSIS — D500 Iron deficiency anemia secondary to blood loss (chronic): Secondary | ICD-10-CM | POA: Diagnosis not present

## 2023-02-13 DIAGNOSIS — Z0001 Encounter for general adult medical examination with abnormal findings: Secondary | ICD-10-CM

## 2023-02-13 LAB — MICROALBUMIN / CREATININE URINE RATIO
Creatinine,U: 104.3 mg/dL
Microalb Creat Ratio: 0.7 mg/g (ref 0.0–30.0)
Microalb, Ur: <0.7 mg/dL (ref 0.0–1.9)

## 2023-02-13 LAB — LIPID PANEL
Cholesterol: 159 mg/dL (ref 0–200)
HDL: 58.8 mg/dL (ref >39.00)
LDL Cholesterol: 85 mg/dL (ref 0–99)
NonHDL: 99.73
Total CHOL/HDL Ratio: 3
Triglycerides: 74 mg/dL (ref 0.0–149.0)
VLDL: 14.8 mg/dL (ref 0.0–40.0)

## 2023-02-13 LAB — CBC WITH DIFFERENTIAL/PLATELET
Basophils Absolute: 0.1 10*3/uL (ref 0.0–0.1)
Basophils Relative: 1.4 % (ref 0.0–3.0)
Eosinophils Absolute: 0.2 10*3/uL (ref 0.0–0.7)
Eosinophils Relative: 2 % (ref 0.0–5.0)
HCT: 40.7 % (ref 36.0–46.0)
Hemoglobin: 13.2 g/dL (ref 12.0–15.0)
Lymphocytes Relative: 33.3 % (ref 12.0–46.0)
Lymphs Abs: 2.6 10*3/uL (ref 0.7–4.0)
MCHC: 32.4 g/dL (ref 30.0–36.0)
MCV: 84.9 fL (ref 78.0–100.0)
Monocytes Absolute: 0.4 10*3/uL (ref 0.1–1.0)
Monocytes Relative: 5.7 % (ref 3.0–12.0)
Neutro Abs: 4.5 10*3/uL (ref 1.4–7.7)
Neutrophils Relative %: 57.6 % (ref 43.0–77.0)
Platelets: 351 10*3/uL (ref 150.0–400.0)
RBC: 4.79 Mil/uL (ref 3.87–5.11)
RDW: 14.7 % (ref 11.5–15.5)
WBC: 7.7 10*3/uL (ref 4.0–10.5)

## 2023-02-13 LAB — COMPREHENSIVE METABOLIC PANEL
ALT: 51 U/L — ABNORMAL HIGH (ref 0–35)
AST: 31 U/L (ref 0–37)
Albumin: 4.4 g/dL (ref 3.5–5.2)
Alkaline Phosphatase: 76 U/L (ref 39–117)
BUN: 12 mg/dL (ref 6–23)
CO2: 26 meq/L (ref 19–32)
Calcium: 9.5 mg/dL (ref 8.4–10.5)
Chloride: 102 meq/L (ref 96–112)
Creatinine, Ser: 0.65 mg/dL (ref 0.40–1.20)
GFR: 112.4 mL/min (ref >60.00)
Glucose, Bld: 126 mg/dL — ABNORMAL HIGH (ref 70–99)
Potassium: 4.1 meq/L (ref 3.5–5.1)
Sodium: 138 meq/L (ref 135–145)
Total Bilirubin: 0.3 mg/dL (ref 0.2–1.2)
Total Protein: 7.1 g/dL (ref 6.0–8.3)

## 2023-02-13 LAB — TSH: TSH: 2.77 u[IU]/mL (ref 0.35–5.50)

## 2023-02-13 LAB — HEMOGLOBIN A1C: Hgb A1c MFr Bld: 7.6 % — ABNORMAL HIGH (ref 4.6–6.5)

## 2023-02-13 MED ORDER — LISINOPRIL-HYDROCHLOROTHIAZIDE 20-25 MG PO TABS: 1.0000 | ORAL_TABLET | Freq: Every day | ORAL | 3 refills | Status: DC

## 2023-02-13 NOTE — Patient Instructions (Signed)
 Please return in 3 months to recheck diabetes and blood pressure    I will release your lab results to you on your MyChart account with further instructions. You may see the results before I do, but when I review them I will send you a message with my report or have my assistant call you if things need to be discussed. Please reply to my message with any questions. Thank you!   Today you were given your flu and Tdap vaccinations.    If you have any questions or concerns, please don't hesitate to send me a message via MyChart or call the office at [phone]. Thank you for visiting with us  today! It's our pleasure caring for you.   VISIT SUMMARY:  During your visit today, we discussed your ongoing management of diabetes and hypertension, as well as your recovery from fibroid surgery. We reviewed your current medications and made some adjustments to better control your blood pressure and blood sugar levels. We also talked about your reproductive health and general well-being.  YOUR PLAN:  -TYPE 2 DIABETES MELLITUS: Type 2 Diabetes Mellitus is a condition where your body does not use insulin properly, leading to high blood sugar levels. We discussed the possibility of adding a new medication, GLP-1 inhibitors, to help with weight loss and better glucose control. We will continue your current medication, metformin , and have ordered blood work to check your HbA1c levels. We will reassess your diabetes management in 3 months.  -HYPERTENSION: Hypertension, or high blood pressure, is a condition where the force of the blood against your artery walls is too high. We have decided to increase your current medication, lisinopril -HCTZ, to 20/25 mg to better control your blood pressure. We will monitor your blood pressure and discuss any potential medication changes if you plan to conceive.  -POST-SURGICAL RECOVERY: You are recovering well from your fibroid removal surgery with no pain and minimal  complications. Your recent menstrual cycle was lighter than expected, which is likely due to hormonal adjustments post-surgery. It may take 3-6 months for your menstrual cycles to normalize. We will monitor your cycles and discuss your hormone levels with your gynecologist in February.  -GENERAL HEALTH MAINTENANCE: You received tetanus and flu vaccinations during your visit. Please monitor for any adverse reactions to these vaccinations.  INSTRUCTIONS:  Please schedule a follow-up appointment for the end of March or early April. We will reassess your diabetes management and blood pressure control at that time. Additionally, discuss your hormone levels with your gynecologist in February.

## 2023-02-13 NOTE — Progress Notes (Signed)
 Subjective  Chief Complaint  Patient presents with   Annual Exam    Pt here for Annual Exam and is currently fasting    Diabetes   Hypertension    HPI: Patricia Chase is a 38 y.o. female who presents to [facility] [place] today for a Female Wellness Visit.  She also has the concerns and/or needs as listed above in the chief complaint. These will be addressed in addition to the Health Maintenance Visit.   Wellness Visit: annual visit with health maintenance review and exam HM: She had her myomectomy in of September and is recovered well.  I reviewed all notes from gynecology.  Also sees a gynecologist for female wellness and Pap smear was done in February with normal results and negative HPV screening.  Immunizations: Eligible for Tdap and flu vaccine today.   Chronic disease management visit and/or acute problem visit: Discussed the use of AI scribe software for clinical note transcription with the patient, who gave verbal consent to proceed.  History of Present Illness   The patient, with a history of diabetes, hypertension, and fibroids, presents for a routine follow-up. Over the past year, the patient underwent surgery for fibroids, which was successful and had a good recovery. The patient reports no pain post-surgery and had a good support system during recovery. The patient's menstrual cycle returned on Christmas Eve, which was lighter than expected. The patient also reports variable appetite, sometimes feeling hungry all the time, and other times having no appetite at all.  The patient's diabetes management with metformin  is ongoing, but she reports high blood sugar levels during her hospital stay post-surgery. The patient also mentions feeling thirsty frequently and carries a water bottle to ensure she is drinking water instead of sugary drinks. No retinopathy; eye exam due end of this month. Diet is fair. Would like to lose weight.   The patient's hypertension is  being managed with Zestoretic  or lisinopril  hydrochlorothiazide . Feeling well. Taking medications w/o adverse effects. No symptoms of CHF, angina; no palpitations, sob, cp or lower extremity edema. Compliant with meds.   Regarding reproductive health, the patient is not currently on birth control following her surgery and is not actively trying to conceive. The patient is not currently in a relationship and expresses a desire not to bring a child into the world by herself. She is open to the possibility of having a child if circumstances change but is also comfortable with the possibility of not having children.      Assessment  1. Encounter for well adult exam with abnormal findings   2. Need for influenza vaccination   3. Controlled type 2 diabetes mellitus without complication, without long-term current use of insulin (HCC)   4. Hypertension associated with diabetes (HCC)   5. Morbid obesity (HCC)   6. Iron  deficiency anemia due to chronic blood loss   7. Menorrhagia with regular cycle   8. Status post myomectomy      Plan  Female Wellness Visit: Age appropriate Health Maintenance and Prevention measures were discussed with patient. Included topics are cancer screening recommendations, ways to keep healthy (see AVS) including dietary and exercise recommendations, regular eye and dental care, use of seat belts, and avoidance of moderate alcohol use and tobacco use.  BMI: discussed patient's BMI and encouraged positive lifestyle modifications to help get to or maintain a target BMI. HM needs and immunizations were addressed and ordered. See below for orders. See HM and immunization section for  updates. Routine labs and screening tests ordered including cmp, cbc and lipids where appropriate. Discussed recommendations regarding Vit D and calcium supplementation (see AVS)  Chronic disease f/u and/or acute problem visit: (deemed necessary to be done in addition to the wellness  visit): Assessment and Plan    Type 2 Diabetes Mellitus Type 2 Diabetes Mellitus with recent hyperglycemia post-surgery, likely due to perioperative stress and intake of sugary drinks. Currently managed with metformin . Reports variable appetite and occasional constant hunger. Discussed potential benefits of GLP-1 inhibitors for weight loss and improved glucose control. Explained that GLP-1 inhibitors can lead to significant weight loss and improved glucose metabolism by affecting liver glucose metabolism, insulin use, and reducing appetite. Medications can be adjusted based on individual response and needs. Patient is considering this option. - Order blood work including HbA1c - Consider GLP-1 inhibitors (e.g., Ozempic) if HbA1c is not well controlled - Continue metformin  1000 bid - lipids have been fairly good; no statin due to reproductive status. Defering for now. - Follow up in 3 months to reassess diabetes management  Hypertension Hypertension with diastolic readings consistently in the 80s. Currently on lisinopril -HCTZ. Plan to increase dosage for better control. Discussed that ACE inhibitors like lisinopril -HCTZ are contraindicated in pregnancy and will need to be changed if planning to conceive. - Increase lisinopril -HCTZ to 20/25 mg, we discussed needing to change this medication in the event she starts trying for pregnancy. Pt understands.  - Monitor blood pressure - Discuss potential medication change if planning to conceive  Post-Surgical Recovery Post-surgical recovery from fibroid removal. Reports excellent recovery with no pain and minimal complications. Recent menstrual cycle was lighter than expected, likely due to hormonal adjustments post-surgery. Discussed that it may take 3-6 months for menstrual cycles to normalize. No signs of PCOS based on history and surgical findings. - Monitor menstrual cycles for the next 3-6 months - Discuss hormone levels with gynecologist in  February  General Health Maintenance Received tetanus and flu vaccinations during the visit. - Monitor for any adverse reactions to vaccinations  Follow-up - Schedule follow-up appointment for 3 months: recheck dm and htn   Orders Placed This Encounter  Procedures   Flu vaccine trivalent PF, 6mos and older(Flulaval,Afluria,Fluarix,Fluzone)   Tdap vaccine greater than or equal to 7yo IM   CBC with Differential/Platelet   Comprehensive metabolic panel   Lipid panel   Hemoglobin A1c   TSH   Microalbumin / creatinine urine ratio   HM PAP SMEAR   Meds ordered this encounter  Medications   lisinopril -hydrochlorothiazide  (ZESTORETIC ) 20-25 MG tablet    Sig: Take 1 tablet by mouth daily.    Dispense:  90 tablet    Refill:  3       Body mass index is 45.56 kg/m. Wt Readings from Last 3 Encounters:  02/13/23 273 lb 12.8 oz (124.2 kg)  08/11/22 269 lb 12.8 oz (122.4 kg)  05/12/22 270 lb 9.6 oz (122.7 kg)   Need for contraception: No,   Patient Active Problem List   Diagnosis Date Noted Date Diagnosed   Status post myomectomy 02/13/2023    Fibroid uterus 11/07/2021    Controlled type 2 diabetes mellitus without complication, without long-term current use of insulin (HCC) 06/17/2021    Hypertension associated with diabetes (HCC) 01/25/2021     Started on amlodipine  and added ace/hydrochlorothiazide  once childbearing was placed on hold (2024).     Iron  deficiency anemia due to chronic blood loss 01/25/2021    Menorrhagia with regular cycle 01/25/2021  Oral contraceptive use 01/25/2021    Tinea versicolor 07/13/2018    Morbid obesity (HCC) 09/02/2013    Seborrheic dermatitis of scalp 09/02/2013    Health Maintenance  Topic Date Due   Diabetic kidney evaluation - Urine ACR  06/18/2022   Diabetic kidney evaluation - eGFR measurement  11/08/2022   HEMOGLOBIN A1C  02/11/2023   COVID-19 Vaccine (4 - 2024-25 season) 03/01/2023 (Originally 10/12/2022)   OPHTHALMOLOGY EXAM   02/15/2023   FOOT EXAM  08/11/2023   Cervical Cancer Screening (HPV/Pap Cotest)  03/24/2025   DTaP/Tdap/Td (3 - Td or Tdap) 02/12/2033   INFLUENZA VACCINE  Completed   Hepatitis C Screening  Completed   HIV Screening  Completed   HPV VACCINES  Aged Out   Immunization History  Administered Date(s) Administered   Influenza, Seasonal, Injecte, Preservative Fre 02/13/2023   Influenza,inj,Quad PF,6+ Mos 10/22/2020, 11/07/2021   PFIZER(Purple Top)SARS-COV-2 Vaccination 04/20/2019, 05/18/2019, 12/14/2019   PNEUMOCOCCAL CONJUGATE-20 11/07/2021   Tdap 07/06/2012, 02/13/2023   We updated and reviewed the patient's past history in detail and it is documented below. Allergies: Patient  reports current alcohol use of about 1.0 standard drink of alcohol per week. Past Medical History Patient  has a past medical history of Allergy, Chicken pox, Eczema, and Fibroid uterus (11/07/2021). Past Surgical History Patient  has a past surgical history that includes Wisdom tooth extraction. Social History   Socioeconomic History   Marital status: Single    Spouse name: Not on file   Number of children: 0   Years of education: Not on file   Highest education level: Master's degree (e.g., MA, MS, MEng, MEd, MSW, MBA)  Occupational History   Occupation: Acupuncturist; resident education administrator of [employer]  Tobacco Use   Smoking status: Never   Smokeless tobacco: Never  Vaping Use   Vaping status: Never Used  Substance and Sexual Activity   Alcohol use: Yes    Alcohol/week: 1.0 standard drink of alcohol    Types: 1 Glasses of wine per week    Comment: occassionally   Drug use: No   Sexual activity: Yes    Birth control/protection: None  Other Topics Concern   Not on file  Social History Narrative   Not on file   Social Drivers of Health   Financial Resource Strain: Low Risk  (05/12/2022)   Overall Financial Resource Strain (CARDIA)    Difficulty of Paying Living Expenses: Not hard at  all  Food Insecurity: No Food Insecurity (05/12/2022)   Hunger Vital Sign    Worried About Running Out of Food in the Last Year: Never true    Ran Out of Food in the Last Year: Never true  Transportation Needs: No Transportation Needs (05/12/2022)   PRAPARE - Administrator, Civil Service (Medical): No    Lack of Transportation (Non-Medical): No  Physical Activity: Unknown (05/12/2022)   Exercise Vital Sign    Days of Exercise per Week: 0 days    Minutes of Exercise per Session: Not on file  Stress: No Stress Concern Present (05/12/2022)   [employer] of Occupational Health - Occupational Stress Questionnaire    Feeling of Stress : Only a little  Social Connections: Moderately Integrated (05/12/2022)   Social Connection and Isolation Panel [NHANES]    Frequency of Communication with Friends and Family: More than three times a week    Frequency of Social Gatherings with Friends and Family: Once a week    Attends Religious Services: 1  to 4 times per year    Active Member of Clubs or Organizations: Yes    Attends Banker Meetings: More than 4 times per year    Marital Status: Never married   Family History  Problem Relation Age of Onset   Hypertension Mother    Diabetes Mother    Thyroid disease Mother    Heart disease Father    Hypertension Father    Hypertension Maternal Grandmother    Diabetes Maternal Grandmother    Hypertension Paternal Grandmother    Heart disease Paternal Grandfather    Hypertension Paternal Grandfather    Cerebral palsy Brother    Seizures Brother     Review of Systems: Constitutional: negative for fever or malaise Ophthalmic: negative for photophobia, double vision or loss of vision Cardiovascular: negative for chest pain, dyspnea on exertion, or new LE swelling Respiratory: negative for SOB or persistent cough Gastrointestinal: negative for abdominal pain, change in bowel habits or melena Genitourinary: negative for dysuria or  gross hematuria, no abnormal uterine bleeding or disharge Musculoskeletal: negative for new gait disturbance or muscular weakness Integumentary: negative for new or persistent rashes, no breast lumps Neurological: negative for TIA or stroke symptoms Psychiatric: negative for SI or delusions Allergic/Immunologic: negative for hives  Patient Care Team    Relationship Specialty Notifications Start End  Jodie Lavern CROME, MD PCP - General Family Medicine  06/23/17   Mollie Greig PARAS, MD  Dermatology  06/23/17   Luci Hart POUR, MD Consulting Physician Obstetrics and Gynecology  02/13/23   Delana Ted Morrison, DO Consulting Physician Obstetrics and Gynecology  02/13/23     Objective  Vitals: BP 118/84   Pulse (!) 111   Temp 97.8 F (36.6 C)   Ht 5' 5 (1.651 m)   Wt 273 lb 12.8 oz (124.2 kg)   LMP 02/05/2023 (Approximate)   SpO2 (!) 72%   BMI 45.56 kg/m  General:  Well developed, well nourished, no acute distress  Psych:  Alert and orientedx3,normal mood and affect HEENT:  Normocephalic, atraumatic, non-icteric sclera, PERRL, supple neck without adenopathy, mass or thyromegaly Cardiovascular:  Normal S1, S2, RRR without gallop, rub or murmur Respiratory:  Good breath sounds bilaterally, CTAB with normal respiratory effort Gastrointestinal: normal bowel sounds, soft, non-tender, no noted masses. No HSM MSK: no deformities, contusions. Joints are without erythema or swelling.  Skin:  Warm, no rashes or suspicious lesions noted Neurologic:    Mental status is normal. Gross motor and sensory exams are normal. Normal gait. No tremor    Commons side effects, risks, benefits, and alternatives for medications and treatment plan prescribed today were discussed, and the patient expressed understanding of the given instructions. Patient is instructed to call or message via MyChart if he/she has any questions or concerns regarding our treatment plan. No barriers to understanding were identified. We  discussed Red Flag symptoms and signs in detail. Patient expressed understanding regarding what to do in case of urgent or emergency type symptoms.  Medication list was reconciled, printed and provided to the patient in AVS. Patient instructions and summary information was reviewed with the patient as documented in the AVS. This note was prepared with assistance of Dragon voice recognition software. Occasional wrong-word or sound-a-like substitutions may have occurred due to the inherent limitations of voice recognition software .

## 2023-02-14 NOTE — Progress Notes (Signed)
 See mychart note Dear Patricia Chase, I have reviewed your lab results. Most everything looks good! However, your diabetic control has worsened; your A1c is now 7.6 and should be closer to 6.5. As we discussed, I would recommend starting the medication that will help control your diabetes as well as help with weight loss: either ozempic or mounjaro . Please reply to me and let me know if you'd like to start it or we can schedule a quick visit to discuss further. Of course, working hard on diet and exercise and rechecking in 3 months is an alternative option.   Let me know your thoughts!  Sincerely, Dr. Jodie

## 2023-02-16 ENCOUNTER — Encounter: Payer: Self-pay | Admitting: Family Medicine

## 2023-02-20 ENCOUNTER — Encounter: Payer: Self-pay | Admitting: Family Medicine

## 2023-02-20 ENCOUNTER — Ambulatory Visit (INDEPENDENT_AMBULATORY_CARE_PROVIDER_SITE_OTHER): Payer: 59 | Admitting: Family Medicine

## 2023-02-20 VITALS — BP 110/76 | HR 110 | Temp 97.7°F | Ht 65.0 in | Wt 269.0 lb

## 2023-02-20 DIAGNOSIS — E119 Type 2 diabetes mellitus without complications: Secondary | ICD-10-CM

## 2023-02-20 DIAGNOSIS — Z7984 Long term (current) use of oral hypoglycemic drugs: Secondary | ICD-10-CM

## 2023-02-20 DIAGNOSIS — E1165 Type 2 diabetes mellitus with hyperglycemia: Secondary | ICD-10-CM | POA: Diagnosis not present

## 2023-02-20 DIAGNOSIS — Z6841 Body Mass Index (BMI) 40.0 and over, adult: Secondary | ICD-10-CM | POA: Diagnosis not present

## 2023-02-20 MED ORDER — TIRZEPATIDE 2.5 MG/0.5ML ~~LOC~~ SOAJ: 2.5000 mg | SUBCUTANEOUS | 0 refills | Status: DC

## 2023-02-20 NOTE — Patient Instructions (Signed)
 Please follow up as scheduled for your next visit with me: 05/14/2023   If you have any questions or concerns, please don't hesitate to send me a message via MyChart or call the office at [phone]. Thank you for visiting with us  today! It's our pleasure caring for you.   VISIT SUMMARY:  During today's visit, we discussed your diabetes management, recent challenges with diet and stress, and potential new treatment options. We also reviewed your weight management goals and general health maintenance strategies.  YOUR PLAN:  -TYPE 2 DIABETES MELLITUS: Type 2 Diabetes Mellitus is a condition where your body does not use insulin properly, leading to high blood sugar levels. Your recent HbA1c has increased to 7.6%, likely due to dietary changes and stress. We will start you on Mounjaro  (tirzepatide ) 2.5 mg once weekly for one month, then increase to 5 mg once weekly if tolerated. Continue taking metformin  as prescribed. Monitor for side effects like nausea, diarrhea, and constipation. If you experience loose stools, reduce the metformin  dose by half. Follow up in April to assess your diabetes control, weight loss, and medication tolerance.  -WEIGHT MANAGEMENT: Weight management involves maintaining a healthy weight through diet and exercise. We discussed the importance of protein intake, especially from non-meat sources, and avoiding processed foods. Strength training is recommended to prevent muscle mass loss. Mounjaro  can help reduce hunger and support healthier dietary choices. Consider consulting a dietitian or nutritionist for personalized advice.  -GENERAL HEALTH MAINTENANCE: Maintaining general health involves a balanced diet and regular exercise. We discussed dietary improvements, emphasizing plenty of vegetables and fiber. Educational materials on non-meat protein sources and healthy carbohydrates will be provided. Exploring vegetarian or vegan diets can offer additional protein sources. Aim to  maintain a balanced diet with plenty of vegetables and fiber.  INSTRUCTIONS:  Follow up in April to assess diabetes control, weight loss, and medication tolerance. Message the doctor if you experience significant side effects or issues with medication.

## 2023-02-20 NOTE — Progress Notes (Signed)
 Subjective  CC:  Chief Complaint  Patient presents with   Diabetes   Hypertension    HPI: Patricia Chase is a 38 y.o. female who presents to the office today for follow up of diabetes and problems listed above in the chief complaint.  Discussed the use of AI scribe software for clinical note transcription with the patient, who gave verbal consent to proceed.  History of Present Illness   The patient, diagnosed with diabetes, has been on metformin  for about a year with good control. However, she reports a challenging end to the previous year due to work stress and a family bereavement, which may have impacted her diabetes management. She describes fluctuations in appetite, with some days experiencing no appetite and others feeling extremely hungry. She also notes that after consuming a meal rich in vegetables, she felt depleted and hungry again shortly after.  The patient has been trying to maintain a balanced diet, including salads, ham and cheese sandwiches, and a variety of vegetables. However, she reports that after consuming certain foods, particularly those high in grease, sugar, or fiber, she experiences an urgent need to use the bathroom. Despite these dietary challenges, the patient has not reported any adverse effects from the metformin .  The patient's mother, also a diabetic, has been on a GLP-1 medication for about a year and has reported positive results. The patient is open to trying this medication to help manage her diabetes and potentially aid in weight loss. She has also expressed interest in seeking dietary advice from a nutritionist or dietician to help manage her condition better.      Wt Readings from Last 3 Encounters:  02/20/23 269 lb (122 kg)  02/13/23 273 lb 12.8 oz (124.2 kg)  08/11/22 269 lb 12.8 oz (122.4 kg)    BP Readings from Last 3 Encounters:  02/20/23 110/76  02/13/23 118/84  08/11/22 119/81    Assessment  1. Uncontrolled type 2 diabetes mellitus  with hyperglycemia, without long-term current use of insulin (HCC)   2. Diabetes mellitus treated with oral medication (HCC)   3. Morbid obesity (HCC)      Plan  Assessment and Plan    Type 2 Diabetes Mellitus Type 2 Diabetes Mellitus with recent increase in HbA1c to 7.6% from early 6% range over the past year, likely due to dietary changes and stress. Current management includes metformin , well tolerated. Discussed starting Mounjaro  (tirzepatide ) to improve glycemic control and aid in weight loss. Explained Mounjaro 's mechanism, starting at a low dose to minimize side effects, and potential cost benefits with [employer]. - Start Mounjaro  (tirzepatide ) 2.5 mg once weekly for one month - Increase dose to 5 mg once weekly after one month if tolerated - Continue metformin  as prescribed - Monitor for gastrointestinal side effects such as nausea, diarrhea, and constipation - If loose stools become problematic, reduce metformin  dose by half - Follow up in April to assess diabetes control, weight loss, and medication tolerance  Weight Management Weight management is a concern with fluctuations in appetite and dietary intake. Discussed the importance of protein intake, especially non-meat sources, and avoiding processed foods. Emphasized strength training to prevent muscle mass loss, particularly in women. Mounjaro  can help reduce hunger and facilitate healthier dietary choices. - Educate on protein intake and sources, including vegetarian options - Recommend avoiding processed foods and focusing on whole, unprocessed carbohydrates - Encourage strength training to prevent muscle mass loss - Consider consulting a dietitian or nutritionist if feasible  General Health  Maintenance Discussed dietary improvements and long-term health benefits, emphasizing a balanced diet with plenty of vegetables and fiber. - Provide educational materials on non-meat protein sources and healthy carbohydrates -  Encourage exploring vegetarian or vegan diets for additional protein sources - Advise on maintaining a balanced diet with plenty of vegetables and fiber  Follow-up - Follow up in April to assess diabetes control, weight loss, and medication tolerance - Message the doctor if experiencing significant side effects or issues with medication.   No orders of the defined types were placed in this encounter.  Meds ordered this encounter  Medications   tirzepatide  (MOUNJARO ) 2.5 MG/0.5ML Pen    Sig: Inject 2.5 mg into the skin once a week.    Dispense:  2 mL    Refill:  0      Immunization History  Administered Date(s) Administered   Influenza, Seasonal, Injecte, Preservative Fre 02/13/2023   Influenza,inj,Quad PF,6+ Mos 10/22/2020, 11/07/2021   PFIZER(Purple Top)SARS-COV-2 Vaccination 04/20/2019, 05/18/2019, 12/14/2019   PNEUMOCOCCAL CONJUGATE-20 11/07/2021   Tdap 07/06/2012, 02/13/2023    Diabetes Related Lab Review: Lab Results  Component Value Date   HGBA1C 7.6 (H) 02/13/2023   HGBA1C 6.4 (A) 08/11/2022   HGBA1C 6.5 (A) 05/12/2022    Lab Results  Component Value Date   MICROALBUR <0.7 02/13/2023   Lab Results  Component Value Date   CREATININE 0.65 02/13/2023   BUN 12 02/13/2023   NA 138 02/13/2023   K 4.1 02/13/2023   CL 102 02/13/2023   CO2 26 02/13/2023   Lab Results  Component Value Date   CHOL 159 02/13/2023   CHOL 149 11/07/2021   CHOL 139 10/22/2020   Lab Results  Component Value Date   HDL 58.80 02/13/2023   HDL 45.10 11/07/2021   HDL 50.90 10/22/2020   Lab Results  Component Value Date   LDLCALC 85 02/13/2023   LDLCALC 85 11/07/2021   LDLCALC 74 10/22/2020   Lab Results  Component Value Date   TRIG 74.0 02/13/2023   TRIG 91.0 11/07/2021   TRIG 74.0 10/22/2020   Lab Results  Component Value Date   CHOLHDL 3 02/13/2023   CHOLHDL 3 11/07/2021   CHOLHDL 3 10/22/2020   No results found for: LDLDIRECT The ASCVD Risk score (Arnett DK, et  al., 2019) failed to calculate for the following reasons:   The 2019 ASCVD risk score is only valid for ages 30 to 38 I have reviewed the PMH, Fam and Soc history. Patient Active Problem List   Diagnosis Date Noted Date Diagnosed   Status post myomectomy 02/13/2023    Fibroid uterus 11/07/2021    Controlled type 2 diabetes mellitus without complication, without long-term current use of insulin (HCC) 06/17/2021     Dx 2023; started metformin  2024, added mounjaro  2025 Deferring statin until done with reproductive needs. On ace.    Hypertension associated with diabetes (HCC) 01/25/2021     Started on amlodipine  and added ace/hydrochlorothiazide  once childbearing was placed on hold (2024).     Iron  deficiency anemia due to chronic blood loss 01/25/2021    Menorrhagia with regular cycle 01/25/2021    Oral contraceptive use 01/25/2021    Tinea versicolor 07/13/2018    Morbid obesity (HCC) 09/02/2013    Seborrheic dermatitis of scalp 09/02/2013     Social History: Patient  reports that she has never smoked. She has never used smokeless tobacco. She reports current alcohol use of about 1.0 standard drink of alcohol per week. She reports that  she does not use drugs.  Review of Systems: Ophthalmic: negative for eye pain, loss of vision or double vision Cardiovascular: negative for chest pain Respiratory: negative for SOB or persistent cough Gastrointestinal: negative for abdominal pain Genitourinary: negative for dysuria or gross hematuria MSK: negative for foot lesions Neurologic: negative for weakness or gait disturbance  Objective  Vitals: BP 110/76   Pulse (!) 110   Temp 97.7 F (36.5 C)   Ht 5' 5 (1.651 m)   Wt 269 lb (122 kg)   LMP 02/05/2023 (Approximate)   SpO2 96%   BMI 44.76 kg/m  General: well appearing, no acute distress  Psych:  Alert and oriented, normal mood and affect  Diabetic education: ongoing education regarding chronic disease management for diabetes was  given today. We continue to reinforce the ABC's of diabetic management: A1c (<7 or 8 dependent upon patient), tight blood pressure control, and cholesterol management with goal LDL < 100 minimally. We discuss diet strategies, exercise recommendations, medication options and possible side effects. At each visit, we review recommended immunizations and preventive care recommendations for diabetics and stress that good diabetic control can prevent other problems. See below for this patient's data.   Commons side effects, risks, benefits, and alternatives for medications and treatment plan prescribed today were discussed, and the patient expressed understanding of the given instructions. Patient is instructed to call or message via MyChart if he/she has any questions or concerns regarding our treatment plan. No barriers to understanding were identified. We discussed Red Flag symptoms and signs in detail. Patient expressed understanding regarding what to do in case of urgent or emergency type symptoms.  Medication list was reconciled, printed and provided to the patient in AVS. Patient instructions and summary information was reviewed with the patient as documented in the AVS. This note was prepared with assistance of Dragon voice recognition software. Occasional wrong-word or sound-a-like substitutions may have occurred due to the inherent limitations of voice recognition software

## 2023-02-24 ENCOUNTER — Ambulatory Visit: Payer: 59 | Admitting: Family Medicine

## 2023-02-27 LAB — HM DIABETES EYE EXAM

## 2023-03-03 ENCOUNTER — Telehealth: Payer: Self-pay | Admitting: Pharmacist

## 2023-03-03 NOTE — Telephone Encounter (Signed)
Pharmacy Patient Advocate Encounter  Received notification from [facility] that Prior Authorization for [facility] 2.5MG /0.5ML auto-injectors has been APPROVED from 03/03/2023 to 01/21/20289   PA #/Case ID/Reference #:  [id number]

## 2023-03-03 NOTE — Telephone Encounter (Signed)
Pharmacy Patient Advocate Encounter   Received notification from Physician's Office that prior authorization for Mounjaro 2.5MG /0.5ML auto-injectors is required/requested.   Insurance verification completed.   The patient is insured through [facility] .   Per test claim: PA required; PA submitted to above mentioned insurance via CoverMyMeds Key/confirmation #/EOC [id number] Status is pending

## 2023-03-03 NOTE — Telephone Encounter (Signed)
Noted  

## 2023-03-26 ENCOUNTER — Encounter: Payer: Self-pay | Admitting: Family Medicine

## 2023-03-31 ENCOUNTER — Other Ambulatory Visit: Payer: Self-pay | Admitting: Family Medicine

## 2023-03-31 MED ORDER — TIRZEPATIDE 5 MG/0.5ML ~~LOC~~ SOAJ: 5.0000 mg | SUBCUTANEOUS | 2 refills | Status: DC

## 2023-04-09 ENCOUNTER — Other Ambulatory Visit: Payer: Self-pay | Admitting: Family Medicine

## 2023-05-14 ENCOUNTER — Ambulatory Visit: Payer: 59 | Admitting: Family Medicine

## 2023-05-14 ENCOUNTER — Encounter: Payer: Self-pay | Admitting: Family Medicine

## 2023-05-14 VITALS — BP 105/73 | HR 101 | Temp 97.7°F | Ht 65.0 in | Wt 251.6 lb

## 2023-05-14 DIAGNOSIS — D500 Iron deficiency anemia secondary to blood loss (chronic): Secondary | ICD-10-CM

## 2023-05-14 DIAGNOSIS — Z6841 Body Mass Index (BMI) 40.0 and over, adult: Secondary | ICD-10-CM

## 2023-05-14 DIAGNOSIS — E119 Type 2 diabetes mellitus without complications: Secondary | ICD-10-CM

## 2023-05-14 DIAGNOSIS — Z7985 Long-term (current) use of injectable non-insulin antidiabetic drugs: Secondary | ICD-10-CM | POA: Diagnosis not present

## 2023-05-14 DIAGNOSIS — E1159 Type 2 diabetes mellitus with other circulatory complications: Secondary | ICD-10-CM

## 2023-05-14 DIAGNOSIS — I152 Hypertension secondary to endocrine disorders: Secondary | ICD-10-CM

## 2023-05-14 LAB — POCT GLYCOSYLATED HEMOGLOBIN (HGB A1C): Hemoglobin A1C: 5.6 % (ref 4.0–5.6)

## 2023-05-14 LAB — MICROALBUMIN / CREATININE URINE RATIO
Creatinine,U: 171.7 mg/dL
Microalb Creat Ratio: 4.4 mg/g (ref 0.0–30.0)
Microalb, Ur: 0.8 mg/dL (ref 0.0–1.9)

## 2023-05-14 MED ORDER — FERROUS SULFATE 325 (65 FE) MG PO TABS: 325.0000 mg | ORAL_TABLET | Freq: Two times a day (BID) | ORAL | 3 refills | Status: AC

## 2023-05-14 MED ORDER — AMLODIPINE BESYLATE 5 MG PO TABS: 5.0000 mg | ORAL_TABLET | Freq: Every day | ORAL | 3 refills | Status: DC

## 2023-05-14 NOTE — Progress Notes (Signed)
 Subjective  CC:  Chief Complaint  Patient presents with   Hypertension   Diabetes    HPI: Patricia Chase is a 38 y.o. female who presents to the office today for follow up of diabetes and problems listed above in the chief complaint.  Diabetes follow up: Her diabetic control is reported as Improved.  She is now tolerating Mounjaro 5 mg weekly.  This is her second month of that dose.  In the last 3 months her weight is down 20 pounds.  Trying to eat healthy foods.  Eye exam is current.  Was done at [facility] in January.  Will get records.  She denies exertional CP or SOB or symptomatic hypoglycemia. She denies foot sores or paresthesias.  Needs new urine nephropathy screening.  She is on an ACE inhibitor.  Holding statin due to reproductive needs.  May be able to start within the next year or 2. Infertility with history of fibroids: Currently not actively trying.  She is on an ACE inhibitor.  She does have follow-up with GYN next week Hypertension is well-controlled.  Blood pressures are on the low normal side.  She denies symptoms of orthostatic hypotension.  She takes amlodipine and lisinopril HCTZ.  No chest pain or palpitations Obesity: As noted above, significant weight loss with the help of Mounjaro and healthy eating. Wt Readings from Last 3 Encounters:  05/14/23 251 lb 9.6 oz (114.1 kg)  02/20/23 269 lb (122 kg)  02/13/23 273 lb 12.8 oz (124.2 kg)    BP Readings from Last 3 Encounters:  05/14/23 105/73  02/20/23 110/76  02/13/23 118/84    Assessment  1. Controlled type 2 diabetes mellitus without complication, without long-term current use of insulin (HCC)   2. Hypertension associated with diabetes (HCC)   3. Morbid obesity (HCC)   4. Iron deficiency anemia due to chronic blood loss      Plan  Diabetes is currently very well controlled.  Continue metformin 1000 twice daily and Mounjaro.  Given possible history of PCOS of irregular menses, will continue metformin for  ovulation control until she is certain she does not need this.  Check urine nephropathy screening on ACE inhibitor. Hypertension is well-controlled.  Will decrease amlodipine to 5 mg daily.  Continue lisinopril HCTZ.  Want to avoid low blood pressures with ongoing weight loss.  Education given. Continue iron supplements Infertility: Continue metformin.  Follow up: 3 months to recheck blood pressure weight and diabetes Orders Placed This Encounter  Procedures   Microalbumin / creatinine urine ratio   POCT HgB A1C   Meds ordered this encounter  Medications   amLODipine (NORVASC) 5 MG tablet    Sig: Take 1 tablet (5 mg total) by mouth daily.    Dispense:  90 tablet    Refill:  3    Decreasing down from 10mg  dose.   ferrous sulfate 325 (65 FE) MG tablet    Sig: Take 1 tablet (325 mg total) by mouth 2 (two) times daily with a meal.    Dispense:  180 tablet    Refill:  3      Immunization History  Administered Date(s) Administered   Influenza, Seasonal, Injecte, Preservative Fre 02/13/2023   Influenza,inj,Quad PF,6+ Mos 10/22/2020, 11/07/2021   PFIZER(Purple Top)SARS-COV-2 Vaccination 04/20/2019, 05/18/2019, 12/14/2019   PNEUMOCOCCAL CONJUGATE-20 11/07/2021   Tdap 07/06/2012, 02/13/2023    Diabetes Related Lab Review: Lab Results  Component Value Date   HGBA1C 5.6 05/14/2023   HGBA1C 7.6 (H) 02/13/2023  HGBA1C 6.4 (A) 08/11/2022    Lab Results  Component Value Date   MICROALBUR <0.7 02/13/2023   Lab Results  Component Value Date   CREATININE 0.65 02/13/2023   BUN 12 02/13/2023   NA 138 02/13/2023   K 4.1 02/13/2023   CL 102 02/13/2023   CO2 26 02/13/2023   Lab Results  Component Value Date   CHOL 159 02/13/2023   CHOL 149 11/07/2021   CHOL 139 10/22/2020   Lab Results  Component Value Date   HDL 58.80 02/13/2023   HDL 45.10 11/07/2021   HDL 50.90 10/22/2020   Lab Results  Component Value Date   LDLCALC 85 02/13/2023   LDLCALC 85 11/07/2021   LDLCALC 74  10/22/2020   Lab Results  Component Value Date   TRIG 74.0 02/13/2023   TRIG 91.0 11/07/2021   TRIG 74.0 10/22/2020   Lab Results  Component Value Date   CHOLHDL 3 02/13/2023   CHOLHDL 3 11/07/2021   CHOLHDL 3 10/22/2020   No results found for: "LDLDIRECT" The ASCVD Risk score (Arnett DK, et al., 2019) failed to calculate for the following reasons:   The 2019 ASCVD risk score is only valid for ages 37 to 59 I have reviewed the PMH, Fam and Soc history. Patient Active Problem List   Diagnosis Date Noted Date Diagnosed   Controlled type 2 diabetes mellitus without complication, without long-term current use of insulin (HCC) 06/17/2021     Priority: High    Dx 2023; started metformin 2024, added mounjaro 2025 Deferring statin until done with reproductive needs. On ace.    Hypertension associated with diabetes (HCC) 01/25/2021     Priority: High    Started on amlodipine and added ace/hydrochlorothiazide once childbearing was placed on hold (2024).     Morbid obesity (HCC) 09/02/2013     Priority: High   Fibroid uterus 11/07/2021     Priority: Medium    Iron deficiency anemia due to chronic blood loss 01/25/2021     Priority: Medium    Menorrhagia with regular cycle 01/25/2021     Priority: Medium    Oral contraceptive use 01/25/2021     Priority: Medium    Status post myomectomy 02/13/2023     Priority: Low   Tinea versicolor 07/13/2018     Priority: Low   Seborrheic dermatitis of scalp 09/02/2013     Priority: Low    Social History: Patient  reports that she has never smoked. She has never used smokeless tobacco. She reports current alcohol use of about 1.0 standard drink of alcohol per week. She reports that she does not use drugs.  Review of Systems: Ophthalmic: negative for eye pain, loss of vision or double vision Cardiovascular: negative for chest pain Respiratory: negative for SOB or persistent cough Gastrointestinal: negative for abdominal  pain Genitourinary: negative for dysuria or gross hematuria MSK: negative for foot lesions Neurologic: negative for weakness or gait disturbance  Objective  Vitals: BP 105/73   Pulse (!) 101   Temp 97.7 F (36.5 C)   Ht 5\' 5"  (1.651 m)   Wt 251 lb 9.6 oz (114.1 kg)   SpO2 100%   BMI 41.87 kg/m  General: well appearing, no acute distress  Psych:  Alert and oriented, normal mood and affect    Diabetic education: ongoing education regarding chronic disease management for diabetes was given today. We continue to reinforce the ABC's of diabetic management: A1c (<7 or 8 dependent upon patient), tight blood pressure control,  and cholesterol management with goal LDL < 100 minimally. We discuss diet strategies, exercise recommendations, medication options and possible side effects. At each visit, we review recommended immunizations and preventive care recommendations for diabetics and stress that good diabetic control can prevent other problems. See below for this patient's data.   Commons side effects, risks, benefits, and alternatives for medications and treatment plan prescribed today were discussed, and the patient expressed understanding of the given instructions. Patient is instructed to call or message via MyChart if he/she has any questions or concerns regarding our treatment plan. No barriers to understanding were identified. We discussed Red Flag symptoms and signs in detail. Patient expressed understanding regarding what to do in case of urgent or emergency type symptoms.  Medication list was reconciled, printed and provided to the patient in AVS. Patient instructions and summary information was reviewed with the patient as documented in the AVS. This note was prepared with assistance of Dragon voice recognition software. Occasional wrong-word or sound-a-like substitutions may have occurred due to the inherent limitations of voice recognition software

## 2023-05-14 NOTE — Patient Instructions (Addendum)
 Please return in 3 months to recheck diabetes and blood pressure    Please set up an appointment for a diabetic eye exam and have the results sent to me.  3 months for ADD follow up  If you have any questions or concerns, please don't hesitate to send me a message via MyChart or call the office at [phone]. Thank you for visiting with [place] today! It's our pleasure caring for you.   VISIT SUMMARY:  During your follow-up visit, we discussed your significant weight loss, diabetes management, blood pressure control, iron supplementation, and reproductive health. Your diabetes is well-controlled, and your blood pressure is stable. We also reviewed your recent eye exam and discussed plans for managing your conditions moving forward.  YOUR PLAN:  -OBESITY: You have lost 20 pounds since January, which is improving your overall health and diabetes management. We will continue your current dose of Mounjaro at 5 mg for the next 3-6 months. It's important to maintain a steady weight loss to avoid nutritional deficiencies, liver strain, hair thinning, and muscle mass loss. Please ensure you are getting enough protein and include strength training exercises in your routine.  -TYPE 2 DIABETES MELLITUS: Your diabetes is well-controlled with an A1C of 5.6%, and your weight loss is helping to improve your blood sugar levels. We will continue with your current diabetes management plan. Please provide a urine sample today for kidney screening.  -HYPERTENSION: Your blood pressure is well-controlled at 105/73 mmHg. Due to your ongoing weight loss, we will reduce your amlodipine dose by half. Please start the 5mg  dose daily.   -IRON DEFICIENCY: Your prescription for iron supplements ran out, and based on your previous lab work, you need to continue supplementation. We will refill your iron supplement prescription.  -GENERAL HEALTH MAINTENANCE: You had an eye exam in January and received new glasses. We will obtain  the records from your recent eye exam at [facility].  INSTRUCTIONS:  Please schedule a follow-up appointment in July. Additionally, provide a urine sample at the lab on your way out today.

## 2023-06-22 ENCOUNTER — Other Ambulatory Visit: Payer: Self-pay | Admitting: Family Medicine

## 2023-08-13 ENCOUNTER — Ambulatory Visit: Admitting: Family Medicine

## 2023-08-13 ENCOUNTER — Encounter: Payer: Self-pay | Admitting: Family Medicine

## 2023-08-13 VITALS — BP 92/60 | HR 110 | Temp 98.0°F | Ht 65.0 in | Wt 241.0 lb

## 2023-08-13 DIAGNOSIS — E1159 Type 2 diabetes mellitus with other circulatory complications: Secondary | ICD-10-CM | POA: Diagnosis not present

## 2023-08-13 DIAGNOSIS — Z7985 Long-term (current) use of injectable non-insulin antidiabetic drugs: Secondary | ICD-10-CM | POA: Diagnosis not present

## 2023-08-13 DIAGNOSIS — I152 Hypertension secondary to endocrine disorders: Secondary | ICD-10-CM | POA: Diagnosis not present

## 2023-08-13 DIAGNOSIS — E119 Type 2 diabetes mellitus without complications: Secondary | ICD-10-CM

## 2023-08-13 LAB — POCT GLYCOSYLATED HEMOGLOBIN (HGB A1C): Hemoglobin A1C: 5.6 % (ref 4.0–5.6)

## 2023-08-13 MED ORDER — TIRZEPATIDE 7.5 MG/0.5ML ~~LOC~~ SOAJ: 7.5000 mg | SUBCUTANEOUS | 5 refills | Status: DC

## 2023-08-13 NOTE — Patient Instructions (Addendum)
 Please return in 6 months for your annual complete physical; please come fasting.  For follow up on chronic medical conditions   If you have any questions or concerns, please don't hesitate to send me a message via MyChart or call the office at [phone]. Thank you for visiting with us  today! It's our pleasure caring for you.    VISIT SUMMARY: Today, we discussed your progress with weight management and blood pressure control. You have successfully lost 30 pounds over the past six months with the help of Mounjaro , and your A1c level is at 5.8, indicating good blood sugar control. We also reviewed your current medications and made some adjustments to better support your health goals.  YOUR PLAN: -OBESITY: Obesity is a condition characterized by excessive body fat. You have lost 30 pounds over the past six months with Mounjaro , which is excellent progress. To continue your weight loss journey, we will increase your Mounjaro  dose to 7.5 mg weekly. Make sure to maintain adequate protein intake, and consider using protein shakes if needed. Additionally, keep up with physical activity to help maintain muscle mass.  -HYPERTENSION: Hypertension, or high blood pressure, is a condition where the force of the blood against your artery walls is too high. Your blood pressure is well-controlled, likely due to your weight loss and current medication. We will discontinue your amlodipine , but you should continue taking lisinopril . Please monitor your blood pressure regularly to ensure it remains under control.  INSTRUCTIONS: Increase your Mounjaro  dose to 7.5 mg weekly. Discontinue amlodipine  and continue taking lisinopril . Monitor your blood pressure regularly. Maintain adequate protein intake and consider using protein shakes. Keep up with physical activity to help maintain muscle mass.                      Contains text generated by [employer].                                  Contains text generated by [employer].

## 2023-08-13 NOTE — Progress Notes (Signed)
 Subjective  CC:  Chief Complaint  Patient presents with   Diabetes   Medical Management of Chronic Issues    HPI: Patricia Chase is a 38 y.o. female who presents to the office today for follow up of diabetes and problems listed above in the chief complaint.  Discussed the use of AI scribe software for clinical note transcription with the patient, who gave verbal consent to proceed.  History of Present Illness Patricia Chase is a 38 year old female with DM, obesity and hypertension who presents for weight management and blood pressure follow-up. Diabetes: Doing very well on Mounjaro  5 mg weekly and metformin  1000 twice a day.  Eating healthy.  No neuropathy symptoms.  Diabetic care is current. She has lost approximately 30 pounds since starting Mounjaro  six months ago. Her clothes fit better, and others have commented on her weight loss. However, she feels that the weight loss has slowed down recently.  Hypertension: Denies symptoms of orthostatic hypotension.  No palpitations or chest pain.  Blood pressures continue to trend down in spite of decreasing amlodipine  from 10-5 3 months ago.  She also takes lisinopril  HCTZ.  Hyperlipidemia deferring statin due to possible reproductive needs.  Obesity: As above, doing very well on Mounjaro .  No adverse effects.  Takes protein shakes     Wt Readings from Last 3 Encounters:  08/13/23 241 lb (109.3 kg)  05/14/23 251 lb 9.6 oz (114.1 kg)  02/20/23 269 lb (122 kg)    BP Readings from Last 3 Encounters:  08/13/23 92/60  05/14/23 105/73  02/20/23 110/76    Assessment  1. Controlled type 2 diabetes mellitus without complication, without long-term current use of insulin (HCC)   2. Hypertension associated with diabetes (HCC)   3. Morbid obesity (HCC)      Plan  Assessment and Plan Assessment & Plan Obesity Weight loss of 30 pounds over six months with Mounjaro . A1c at 5.8 indicates good glycemic control. Recommend increasing Mounjaro   to sustain weight loss. - Increase Mounjaro  to 7.5 mg weekly. - Ensure adequate protein intake, consider protein shakes. - Encourage physical activity to maintain muscle mass.  Hypertension Blood pressure well-controlled, likely due to weight loss and medication. Discontinue amlodipine  due to effective weight loss. - Discontinue amlodipine . - Continue lisinopril  HCTZ - Monitor blood pressure regularly.  Diabetes: Very well-controlled with A1c at 5.8.  Continue Mounjaro , increase dose for further weight loss.  Continue ACE inhibitor.  Continue footcare and diabetic eye exams.  Will likely institute statin at her next physical if she is not wanting to become pregnant.  She did have her female wellness visit recently.    Follow up: 6 months for complete physical and follow-up chronic problems Orders Placed This Encounter  Procedures   POCT HgB A1C   Meds ordered this encounter  Medications   tirzepatide  (MOUNJARO ) 7.5 MG/0.5ML Pen    Sig: Inject 7.5 mg into the skin once a week.    Dispense:  2 mL    Refill:  5      Immunization History  Administered Date(s) Administered   Influenza, Seasonal, Injecte, Preservative Fre 02/13/2023   Influenza,inj,Quad PF,6+ Mos 10/22/2020, 11/07/2021   PFIZER(Purple Top)SARS-COV-2 Vaccination 04/20/2019, 05/18/2019, 12/14/2019   PNEUMOCOCCAL CONJUGATE-20 11/07/2021   Tdap 07/06/2012, 02/13/2023    Diabetes Related Lab Review: Lab Results  Component Value Date   HGBA1C 5.6 08/13/2023   HGBA1C 5.6 05/14/2023   HGBA1C 7.6 (H) 02/13/2023    Lab Results  Component Value Date  MICROALBUR 0.8 05/14/2023   Lab Results  Component Value Date   CREATININE 0.65 02/13/2023   BUN 12 02/13/2023   NA 138 02/13/2023   K 4.1 02/13/2023   CL 102 02/13/2023   CO2 26 02/13/2023   Lab Results  Component Value Date   CHOL 159 02/13/2023   CHOL 149 11/07/2021   CHOL 139 10/22/2020   Lab Results  Component Value Date   HDL 58.80 02/13/2023    HDL 45.10 11/07/2021   HDL 50.90 10/22/2020   Lab Results  Component Value Date   LDLCALC 85 02/13/2023   LDLCALC 85 11/07/2021   LDLCALC 74 10/22/2020   Lab Results  Component Value Date   TRIG 74.0 02/13/2023   TRIG 91.0 11/07/2021   TRIG 74.0 10/22/2020   Lab Results  Component Value Date   CHOLHDL 3 02/13/2023   CHOLHDL 3 11/07/2021   CHOLHDL 3 10/22/2020   No results found for: LDLDIRECT The ASCVD Risk score (Arnett DK, et al., 2019) failed to calculate for the following reasons:   The 2019 ASCVD risk score is only valid for ages 37 to 82 I have reviewed the PMH, Fam and Soc history. Patient Active Problem List   Diagnosis Date Noted   Controlled type 2 diabetes mellitus without complication, without long-term current use of insulin (HCC) 06/17/2021    Priority: High    Dx 2023; started metformin  2024, added mounjaro  2025 Deferring statin until done with reproductive needs. On ace.    Hypertension associated with diabetes (HCC) 01/25/2021    Priority: High    Started on amlodipine  and added ace/hydrochlorothiazide  once childbearing was placed on hold (2024). Stopped amlodipine  08/2023 due to low bp with weight loss. Maintained on ace/hydrochlorothiazide       Morbid obesity (HCC) 09/02/2013    Priority: High   Fibroid uterus 11/07/2021    Priority: Medium    Iron  deficiency anemia due to chronic blood loss 01/25/2021    Priority: Medium    Menorrhagia with regular cycle 01/25/2021    Priority: Medium    Oral contraceptive use 01/25/2021    Priority: Medium    Status post myomectomy 02/13/2023    Priority: Low   Tinea versicolor 07/13/2018    Priority: Low   Seborrheic dermatitis of scalp 09/02/2013    Priority: Low    Social History: Patient  reports that she has never smoked. She has never used smokeless tobacco. She reports current alcohol use of about 1.0 standard drink of alcohol per week. She reports that she does not use drugs.  Review of  Systems: Ophthalmic: negative for eye pain, loss of vision or double vision Cardiovascular: negative for chest pain Respiratory: negative for SOB or persistent cough Gastrointestinal: negative for abdominal pain Genitourinary: negative for dysuria or gross hematuria MSK: negative for foot lesions Neurologic: negative for weakness or gait disturbance  Objective  Vitals: BP 92/60   Pulse (!) 110   Temp 98 F (36.7 C) (Oral)   Ht 5' 5 (1.651 m)   Wt 241 lb (109.3 kg)   LMP 08/11/2023 (Exact Date)   SpO2 98%   BMI 40.10 kg/m  General: well appearing, no acute distress  Psych:  Alert and oriented, normal mood and affect Foot exam: no erythema, pallor, or cyanosis visible nl sensation+2 distal pulses bilaterally  Diabetic education: ongoing education regarding chronic disease management for diabetes was given today. We continue to reinforce the ABC's of diabetic management: A1c (<7 or 8 dependent upon patient), tight  blood pressure control, and cholesterol management with goal LDL < 100 minimally. We discuss diet strategies, exercise recommendations, medication options and possible side effects. At each visit, we review recommended immunizations and preventive care recommendations for diabetics and stress that good diabetic control can prevent other problems. See below for this patient's data. Commons side effects, risks, benefits, and alternatives for medications and treatment plan prescribed today were discussed, and the patient expressed understanding of the given instructions. Patient is instructed to call or message via MyChart if he/she has any questions or concerns regarding our treatment plan. No barriers to understanding were identified. We discussed Red Flag symptoms and signs in detail. Patient expressed understanding regarding what to do in case of urgent or emergency type symptoms.  Medication list was reconciled, printed and provided to the patient in AVS. Patient instructions and  summary information was reviewed with the patient as documented in the AVS. This note was prepared with assistance of Dragon voice recognition software. Occasional wrong-word or sound-a-like substitutions may have occurred due to the inherent limitations of voice recognition software

## 2024-01-09 ENCOUNTER — Other Ambulatory Visit: Payer: Self-pay | Admitting: Family Medicine

## 2024-01-28 ENCOUNTER — Other Ambulatory Visit: Payer: Self-pay | Admitting: Family Medicine

## 2024-02-15 ENCOUNTER — Ambulatory Visit (INDEPENDENT_AMBULATORY_CARE_PROVIDER_SITE_OTHER): Admitting: Family Medicine

## 2024-02-15 ENCOUNTER — Encounter: Payer: Self-pay | Admitting: Family Medicine

## 2024-02-15 VITALS — BP 98/64 | HR 103 | Temp 97.7°F | Ht 65.0 in | Wt 222.4 lb

## 2024-02-15 DIAGNOSIS — L659 Nonscarring hair loss, unspecified: Secondary | ICD-10-CM | POA: Diagnosis not present

## 2024-02-15 DIAGNOSIS — Z23 Encounter for immunization: Secondary | ICD-10-CM | POA: Diagnosis not present

## 2024-02-15 DIAGNOSIS — Z6837 Body mass index (BMI) 37.0-37.9, adult: Secondary | ICD-10-CM | POA: Diagnosis not present

## 2024-02-15 DIAGNOSIS — Z0001 Encounter for general adult medical examination with abnormal findings: Secondary | ICD-10-CM

## 2024-02-15 DIAGNOSIS — E1159 Type 2 diabetes mellitus with other circulatory complications: Secondary | ICD-10-CM

## 2024-02-15 DIAGNOSIS — E119 Type 2 diabetes mellitus without complications: Secondary | ICD-10-CM

## 2024-02-15 DIAGNOSIS — Z7985 Long-term (current) use of injectable non-insulin antidiabetic drugs: Secondary | ICD-10-CM

## 2024-02-15 DIAGNOSIS — D500 Iron deficiency anemia secondary to blood loss (chronic): Secondary | ICD-10-CM

## 2024-02-15 DIAGNOSIS — D259 Leiomyoma of uterus, unspecified: Secondary | ICD-10-CM

## 2024-02-15 DIAGNOSIS — I152 Hypertension secondary to endocrine disorders: Secondary | ICD-10-CM | POA: Diagnosis not present

## 2024-02-15 LAB — COMPREHENSIVE METABOLIC PANEL WITH GFR
ALT: 12 U/L (ref 3–35)
AST: 13 U/L (ref 5–37)
Albumin: 4.2 g/dL (ref 3.5–5.2)
Alkaline Phosphatase: 54 U/L (ref 39–117)
BUN: 9 mg/dL (ref 6–23)
CO2: 25 meq/L (ref 19–32)
Calcium: 9.3 mg/dL (ref 8.4–10.5)
Chloride: 102 meq/L (ref 96–112)
Creatinine, Ser: 0.68 mg/dL (ref 0.40–1.20)
GFR: 110.4 mL/min
Glucose, Bld: 78 mg/dL (ref 70–99)
Potassium: 4.1 meq/L (ref 3.5–5.1)
Sodium: 137 meq/L (ref 135–145)
Total Bilirubin: 0.4 mg/dL (ref 0.2–1.2)
Total Protein: 6.7 g/dL (ref 6.0–8.3)

## 2024-02-15 LAB — CBC WITH DIFFERENTIAL/PLATELET
Basophils Absolute: 0 K/uL (ref 0.0–0.1)
Basophils Relative: 0.5 % (ref 0.0–3.0)
Eosinophils Absolute: 0.1 K/uL (ref 0.0–0.7)
Eosinophils Relative: 0.7 % (ref 0.0–5.0)
HCT: 38.8 % (ref 36.0–46.0)
Hemoglobin: 13.1 g/dL (ref 12.0–15.0)
Lymphocytes Relative: 30.7 % (ref 12.0–46.0)
Lymphs Abs: 2.9 K/uL (ref 0.7–4.0)
MCHC: 33.8 g/dL (ref 30.0–36.0)
MCV: 81.6 fl (ref 78.0–100.0)
Monocytes Absolute: 0.5 K/uL (ref 0.1–1.0)
Monocytes Relative: 5.4 % (ref 3.0–12.0)
Neutro Abs: 6 K/uL (ref 1.4–7.7)
Neutrophils Relative %: 62.7 % (ref 43.0–77.0)
Platelets: 415 K/uL — ABNORMAL HIGH (ref 150.0–400.0)
RBC: 4.75 Mil/uL (ref 3.87–5.11)
RDW: 13.7 % (ref 11.5–15.5)
WBC: 9.5 K/uL (ref 4.0–10.5)

## 2024-02-15 LAB — LIPID PANEL
Cholesterol: 158 mg/dL (ref 28–200)
HDL: 47.7 mg/dL
LDL Cholesterol: 96 mg/dL (ref 10–99)
NonHDL: 109.89
Total CHOL/HDL Ratio: 3
Triglycerides: 71 mg/dL (ref 10.0–149.0)
VLDL: 14.2 mg/dL (ref 0.0–40.0)

## 2024-02-15 LAB — MICROALBUMIN / CREATININE URINE RATIO
Creatinine,U: 191.9 mg/dL
Microalb Creat Ratio: 5.4 mg/g (ref 0.0–30.0)
Microalb, Ur: 1 mg/dL (ref 0.7–1.9)

## 2024-02-15 LAB — B12 AND FOLATE PANEL
Folate: 22.6 ng/mL
Vitamin B-12: 308 pg/mL (ref 211–911)

## 2024-02-15 LAB — TSH: TSH: 1.73 u[IU]/mL (ref 0.35–5.50)

## 2024-02-15 LAB — HEMOGLOBIN A1C: Hgb A1c MFr Bld: 5.5 % (ref 4.6–6.5)

## 2024-02-15 MED ORDER — LISINOPRIL 20 MG PO TABS: 20.0000 mg | ORAL_TABLET | Freq: Every day | ORAL | 3 refills | Status: AC

## 2024-02-15 NOTE — Patient Instructions (Signed)
 Please return in 6 months to recheck diabetes and blood pressure   I will release your lab results to you on your MyChart account with further instructions. You may see the results before I do, but when I review them I will send you a message with my report or have my assistant call you if things need to be discussed. Please reply to my message with any questions. Thank you!   If you have any questions or concerns, please don't hesitate to send me a message via MyChart or call the office at [phone]. Thank you for visiting with us  today! It's our pleasure caring for you.    VISIT SUMMARY: During today's visit, we discussed your significant weight loss, diabetes management, hypertension, uterine fibroids, anemia, and hair loss. We reviewed your current medications and made some adjustments to better manage your conditions. We also discussed your general health maintenance and future follow-up plans.  YOUR PLAN: -TYPE 2 DIABETES MELLITUS WITH HYPERTENSION: Your diabetes is well-controlled with your current medication, Mounjaro  7.5 mg. Due to your significant weight loss, your blood pressure has decreased, allowing us  to discontinue one of your blood pressure medications. We will continue with lisinopril  and monitor your blood pressure. We also ordered fasting blood tests to check your cholesterol levels.  -MORBID OBESITY: You have lost approximately 60 pounds over the past year, which has improved your overall health. We encourage you to increase your protein intake and continue strength training to support muscle mass. We also discussed the potential benefit of creatine supplementation. Blood tests have been ordered to check your thyroid, vitamin D, and B12 levels.  -UTERINE LEIOMYOMA WITH IRON  DEFICIENCY ANEMIA: Your uterine fibroids have regrown, but your bleeding is not excessive. You are managing your iron  deficiency anemia with iron  supplements. We will continue with the iron  supplementation  and have ordered blood tests to check your blood counts and iron  levels. You have a follow-up appointment with your gynecologist in July for further evaluation and an ultrasound.  -ALOPECIA: You have noticed hair loss, which may be due to traction alopecia from braids or nutritional deficiencies. Your weight loss and medication may also contribute to hair thinning. We recommend ensuring adequate protein intake and considering nutritional supplements. Blood tests have been ordered to check your thyroid, vitamin D, and B12 levels.  -GENERAL HEALTH MAINTENANCE: We discussed the HPV vaccination, but you declined due to your low risk of HPV exposure. We will continue to monitor your overall health and make recommendations as needed.  INSTRUCTIONS: Please continue taking your current medications as prescribed. Follow up with your gynecologist in July for further evaluation of your uterine fibroids and an ultrasound. We have ordered several blood tests, including fasting blood tests to check your cholesterol levels, and tests to check your thyroid, vitamin D, B12 levels, blood counts, and iron  levels. Ensure you maintain a balanced diet with adequate protein intake and consider nutritional supplements as discussed. If you have any concerns or notice any changes in your health, please contact our office.                      Contains text generated by [employer].                                 Contains text generated by [employer].

## 2024-02-15 NOTE — Progress Notes (Signed)
 " Subjective  Chief Complaint  Patient presents with   Annual Exam    Pt here for Annual Exam and is currently fasting    Diabetes   Hypertension    HPI: Patricia Chase is a 39 y.o. female who presents to [facility] [place] today for a Female Wellness Visit. She also has the concerns and/or needs as listed above in the chief complaint. These will be addressed in addition to the Health Maintenance Visit.   Wellness Visit: annual visit with health maintenance review and exam  HM: sees gyn; had recent ultrasound.  I reviewed.  Status post myomectomy, however fibroids are returning.  Menstrual cycles remain regular with only 1 heavy day.  On iron .  History of iron  deficiency anemia due to menorrhagia due to fibroids.  Not currently pursuing fertility endeavors.  Not currently sexually active.  Feels well.  Energy levels well.  Pap smear current.  Eye exam up-to-date.  Overall doing well with home life and work life.  Discussed HPV vaccinations, risks and indications.  Patient defers.  Low risk.  Other immunizations current  Chronic disease f/u and/or acute problem visit: (deemed necessary to be done in addition to the wellness visit): Discussed the use of AI scribe software for clinical note transcription with the patient, who gave verbal consent to proceed.  History of Present Illness Patricia Chase is a 39 year old female who presents for a follow-up visit regarding weight loss and medication management. Morbid obesity on Mounjaro : Weight loss and nutritional status - Significant weight loss of approximately 60 pounds over the past year. - Current home weight is 218 pounds, though scale accuracy is uncertain. - Able to fit into clothes again and feels good overall. - Nutrition is inconsistent, with variable appetite and some days finding food unappealing. - Uses protein bars and shakes to supplement diet. - Incorporating strength training and aiming to increase protein intake  to support muscle mass. - Tolerates Mounjaro  well without side effects.  Diabetes management - Currently taking Mounjaro  7.5 mg, which has been effective for glycemic control. - Metformin  1000 twice daily. - No hypoglycemia or complications.  Hypertension - lisinopril  and HCTZ for blood pressure management. -Blood pressure is very well-controlled, running low now.  She does not feel symptomatic at this time.  No palpitations.  - No history of hyperlipidemia.  LDL 85.  Not currently on a statin.  Uterine fibroids and menstrual bleeding - Recent ultrasound shows regrowth of uterine fibroids. - Experiences one heavy bleeding day during menstrual cycle, less severe than prior episodes. - Follow-up appointment scheduled for July to reassess fibroid status.  Anemia and iron  supplementation - Taking iron  supplements for anemia. - No constipation while on iron  supplementation.  Hair loss - Concern about hair loss noticed after removal of braids. - Hairstylist suggested possible relation to nutritional status or other health issues.  Could be related to traction alopecia as well.  Overall no significant thinning at this time.  Sexual and reproductive health - No current reproductive health needs. - Not sexually active.    Assessment  1. Encounter for well adult exam with abnormal findings   2. Need for influenza vaccination   3. Hypertension associated with diabetes (HCC)   4. Morbid obesity (HCC)   5. Uterine leiomyoma, unspecified location   6. Iron  deficiency anemia due to chronic blood loss   7. Controlled type 2 diabetes mellitus without complication, without long-term current use of insulin (HCC)  8. Hair thinning      Plan  Female Wellness Visit: Age appropriate Health Maintenance and Prevention measures were discussed with patient. Included topics are cancer screening recommendations, ways to keep healthy (see AVS) including dietary and exercise recommendations, regular  eye and dental care, use of seat belts, and avoidance of moderate alcohol use and tobacco use.  Screens are current BMI: discussed patient's BMI and encouraged positive lifestyle modifications to help get to or maintain a target BMI. HM needs and immunizations were addressed and ordered. See below for orders. See HM and immunization section for updates.  Declines HPV vaccines.  Otherwise up-to-date. Routine labs and screening tests ordered including cmp, cbc and lipids where appropriate. Discussed recommendations regarding Vit D and calcium supplementation (see AVS)  Chronic disease management visit and/or acute problem visit: Assessment and Plan Assessment & Plan Type 2 diabetes mellitus with hypertension Diabetes is well-controlled with significant weight loss. Blood pressure is low, likely due to weight loss and discontinuation of antihypertensive medication. Current regimen includes Mounjaro  7.5 mg, which is effective. - Continue Mounjaro  7.5 mg. - Discontinued HCTZ and continued lisinopril  20 mg daily.  Monitor blood pressures.  Check renal function and electrolytes. - Ordered fasting blood tests to check cholesterol levels.  Will defer statin as long as A1c remains in the normal range. - Annual eye exams current.  Morbid obesity Significant weight loss of approximately 60 pounds over the past year. Current weight is 218 pounds. Weight loss has improved metabolic parameters and reduced blood pressure. - Encouraged increased protein intake and strength training. - Consider creatine supplementation to aid muscle mass building. - Ordered blood tests to check thyroid, vitamin D, and B12 levels. - Monitor hair thinning  Uterine leiomyoma with iron  deficiency anemia Fibroids have regrown, but bleeding is not excessive. Iron  deficiency anemia is managed with iron  supplementation. Hair loss may be related to nutritional deficiencies or medication effects. - Continue iron  supplementation. -  Ordered blood tests to check blood counts and iron  levels. - Follow up with gynecologist in July for further evaluation and ultrasound.  Alopecia Hair loss is localized, possibly due to traction alopecia from braids or nutritional deficiencies. Weight loss and medication may contribute to hair thinning. - Ensure adequate protein intake and consider nutritional supplements. - Ordered blood tests to check thyroid, vitamin D, and B12 levels.  General health maintenance Discussed HPV vaccination, but she declined due to low risk of HPV exposure. - Declined HPV vaccination.    Follow up: 6 months recheck diabetes and blood pressure Orders Placed This Encounter  Procedures   Flu vaccine trivalent PF, 6mos and older(Flulaval,Afluria,Fluarix,Fluzone)   CBC with Differential/Platelet   Comprehensive metabolic panel with GFR   Lipid panel   Hemoglobin A1c   TSH   Microalbumin / creatinine urine ratio   Iron , TIBC and Ferritin Panel   B12 and Folate Panel   No orders of the defined types were placed in this encounter.     Body mass index is 37.01 kg/m. Wt Readings from Last 3 Encounters:  02/15/24 222 lb 6.4 oz (100.9 kg)  08/13/23 241 lb (109.3 kg)  05/14/23 251 lb 9.6 oz (114.1 kg)     Patient Active Problem List   Diagnosis Date Noted Date Diagnosed   Controlled type 2 diabetes mellitus without complication, without long-term current use of insulin (HCC) 06/17/2021     Priority: High    Dx 2023; started metformin  2024, added mounjaro  2025 Deferring statin until done with reproductive  needs. On ace.    Hypertension associated with diabetes (HCC) 01/25/2021     Priority: High    Started on amlodipine  and added ace/hydrochlorothiazide  once childbearing was placed on hold (2024). Stopped amlodipine  08/2023 due to low bp with weight loss. Maintained on ace/hydrochlorothiazide       Morbid obesity (HCC) 09/02/2013     Priority: High   Fibroid uterus 11/07/2021     Priority:  Medium    Iron  deficiency anemia due to chronic blood loss 01/25/2021     Priority: Medium    Menorrhagia with regular cycle 01/25/2021     Priority: Medium    Oral contraceptive use 01/25/2021     Priority: Medium    Status post myomectomy 02/13/2023     Priority: Low   Tinea versicolor 07/13/2018     Priority: Low   Seborrheic dermatitis of scalp 09/02/2013     Priority: Low   Health Maintenance  Topic Date Due   Hepatitis B Vaccines 19-59 Average Risk (1 of 3 - 19+ 3-dose series) Never done   HPV VACCINES (1 - 3-dose SCDM series) Never done   HEMOGLOBIN A1C  02/13/2024   COVID-19 Vaccine (4 - 2025-26 season) 03/02/2024 (Originally 10/12/2023)   OPHTHALMOLOGY EXAM  02/27/2024   FOOT EXAM  08/12/2024   Cervical Cancer Screening (HPV/Pap Cotest)  03/24/2025   DTaP/Tdap/Td (3 - Td or Tdap) 02/12/2033   Pneumococcal Vaccine  Completed   Influenza Vaccine  Completed   Hepatitis C Screening  Completed   HIV Screening  Completed   Meningococcal B Vaccine  Aged Out   Immunization History  Administered Date(s) Administered   Influenza, Seasonal, Injecte, Preservative Fre 02/13/2023, 02/15/2024   Influenza,inj,Quad PF,6+ Mos 10/22/2020, 11/07/2021   PFIZER(Purple Top)SARS-COV-2 Vaccination 04/20/2019, 05/18/2019, 12/14/2019   PNEUMOCOCCAL CONJUGATE-20 11/07/2021   Tdap 07/06/2012, 02/13/2023   We updated and reviewed the patient's past history in detail and it is documented below. Allergies: Patient has no known allergies. Past Medical History Patient  has a past medical history of Allergy, Chicken pox, Eczema, and Fibroid uterus (11/07/2021). Past Surgical History Patient  has a past surgical history that includes Wisdom tooth extraction. Family History: Patient family history includes Cerebral palsy in her brother; Diabetes in her maternal grandmother and mother; Heart disease in her father and paternal grandfather; Hypertension in her father, maternal grandmother, mother,  paternal grandfather, and paternal grandmother; Seizures in her brother; Thyroid disease in her mother. Social History:  Patient  reports that she has never smoked. She has never used smokeless tobacco. She reports current alcohol use of about 1.0 standard drink of alcohol per week. She reports that she does not use drugs.  Review of Systems: Constitutional: negative for fever or malaise Ophthalmic: negative for photophobia, double vision or loss of vision Cardiovascular: negative for chest pain, dyspnea on exertion, or new LE swelling Respiratory: negative for SOB or persistent cough Gastrointestinal: negative for abdominal pain, change in bowel habits or melena Genitourinary: negative for dysuria or gross hematuria, no abnormal uterine bleeding or disharge Musculoskeletal: negative for new gait disturbance or muscular weakness Integumentary: negative for new or persistent rashes, no breast lumps Neurological: negative for TIA or stroke symptoms Psychiatric: negative for SI or delusions Allergic/Immunologic: negative for hives  Patient Care Team    Relationship Specialty Notifications Start End  Jodie Lavern CROME, MD PCP - General Family Medicine  06/23/17   Mollie Greig PARAS, MD  Dermatology  06/23/17   Luci Hart POUR, MD Consulting Physician Obstetrics  and Gynecology  02/13/23   Delana Ted Morrison, DO Consulting Physician Obstetrics and Gynecology  02/13/23     Objective  Vitals: BP 98/64   Pulse (!) 103   Temp 97.7 F (36.5 C)   Ht 5' 5 (1.651 m)   Wt 222 lb 6.4 oz (100.9 kg)   SpO2 99%   BMI 37.01 kg/m  General:  Well developed, well nourished, no acute distress  Psych:  Alert and orientedx3,normal mood and affect HEENT:  Normocephalic, atraumatic, non-icteric sclera,  supple neck without adenopathy, mass or thyromegaly Cardiovascular:  Normal S1, S2, RRR without gallop, rub or murmur Respiratory:  Good breath sounds bilaterally, CTAB with normal respiratory  effort Gastrointestinal: normal bowel sounds, soft, non-tender, no noted masses. No HSM MSK: extremities without edema, joints without erythema or swelling Neurologic:    Mental status is normal.  Gross motor and sensory exams are normal.  No tremor  Commons side effects, risks, benefits, and alternatives for medications and treatment plan prescribed today were discussed, and the patient expressed understanding of the given instructions. Patient is instructed to call or message via MyChart if he/she has any questions or concerns regarding our treatment plan. No barriers to understanding were identified. We discussed Red Flag symptoms and signs in detail. Patient expressed understanding regarding what to do in case of urgent or emergency type symptoms.  Medication list was reconciled, printed and provided to the patient in AVS. Patient instructions and summary information was reviewed with the patient as documented in the AVS. This note was prepared with assistance of Dragon voice recognition software. Occasional wrong-word or sound-a-like substitutions may have occurred due to the inherent limitations of voice recognition software     "

## 2024-02-16 LAB — IRON,TIBC AND FERRITIN PANEL
%SAT: 16 % (ref 16–45)
Ferritin: 18 ng/mL (ref 16–154)
Iron: 51 ug/dL (ref 40–190)
TIBC: 314 ug/dL (ref 250–450)

## 2024-02-17 ENCOUNTER — Ambulatory Visit: Payer: Self-pay | Admitting: Family Medicine

## 2024-02-17 NOTE — Progress Notes (Signed)
 See mychart note Dear Ms. Joshua, Things look good! Your sugar and cholesterol are ok Your blood count is stable although your vitamin B12 is low so start taking otc vit b12 1000mg  daily. Continue taking iron  as your stores are trending downward again but you are not anemic. Your sugar is better.  No other medication changes are needed at this time.  Sincerely, Dr. Jodie

## 2024-08-15 ENCOUNTER — Ambulatory Visit: Admitting: Family Medicine

## 2024-08-19 ENCOUNTER — Ambulatory Visit: Admitting: Family Medicine

## 2025-02-15 ENCOUNTER — Encounter: Admitting: Family Medicine
